# Patient Record
Sex: Female | Born: 1955 | Race: White | Hispanic: No | Marital: Married | Smoking: Never smoker
Health system: Southern US, Community
[De-identification: ages and names within clinical notes are randomized; demographics above are authoritative.]

## PROBLEM LIST (undated history)

## (undated) DIAGNOSIS — D229 Melanocytic nevi, unspecified: Secondary | ICD-10-CM

## (undated) DIAGNOSIS — M1990 Unspecified osteoarthritis, unspecified site: Secondary | ICD-10-CM

## (undated) DIAGNOSIS — T8859XA Other complications of anesthesia, initial encounter: Secondary | ICD-10-CM

## (undated) DIAGNOSIS — T4145XA Adverse effect of unspecified anesthetic, initial encounter: Secondary | ICD-10-CM

## (undated) DIAGNOSIS — K5792 Diverticulitis of intestine, part unspecified, without perforation or abscess without bleeding: Secondary | ICD-10-CM

## (undated) HISTORY — PX: BREAST EXCISIONAL BIOPSY: SUR124

## (undated) HISTORY — DX: Melanocytic nevi, unspecified: D22.9

## (undated) HISTORY — DX: Diverticulitis of intestine, part unspecified, without perforation or abscess without bleeding: K57.92

## (undated) HISTORY — PX: LIPOMA EXCISION: SHX5283

---

## 1999-07-19 HISTORY — PX: CARPAL TUNNEL RELEASE: SHX101

## 1999-08-19 ENCOUNTER — Encounter: Payer: Self-pay | Admitting: *Deleted

## 1999-08-19 ENCOUNTER — Encounter: Admission: RE | Admit: 1999-08-19 | Discharge: 1999-08-19 | Payer: Self-pay | Admitting: *Deleted

## 2001-01-22 ENCOUNTER — Other Ambulatory Visit: Admission: RE | Admit: 2001-01-22 | Discharge: 2001-01-22 | Payer: Self-pay | Admitting: Obstetrics and Gynecology

## 2001-01-30 ENCOUNTER — Encounter: Admission: RE | Admit: 2001-01-30 | Discharge: 2001-01-30 | Payer: Self-pay | Admitting: Obstetrics and Gynecology

## 2001-01-30 ENCOUNTER — Encounter: Payer: Self-pay | Admitting: Obstetrics and Gynecology

## 2002-02-12 ENCOUNTER — Encounter: Payer: Self-pay | Admitting: Obstetrics and Gynecology

## 2002-02-12 ENCOUNTER — Encounter: Admission: RE | Admit: 2002-02-12 | Discharge: 2002-02-12 | Payer: Self-pay | Admitting: Obstetrics and Gynecology

## 2002-02-18 ENCOUNTER — Ambulatory Visit (HOSPITAL_COMMUNITY): Admission: RE | Admit: 2002-02-18 | Discharge: 2002-02-18 | Payer: Self-pay | Admitting: Obstetrics and Gynecology

## 2002-02-18 ENCOUNTER — Encounter: Payer: Self-pay | Admitting: Obstetrics and Gynecology

## 2002-04-04 ENCOUNTER — Ambulatory Visit (HOSPITAL_COMMUNITY): Admission: RE | Admit: 2002-04-04 | Discharge: 2002-04-04 | Payer: Self-pay | Admitting: Obstetrics and Gynecology

## 2002-04-04 HISTORY — PX: PELVIC LAPAROSCOPY: SHX162

## 2002-11-07 DIAGNOSIS — D229 Melanocytic nevi, unspecified: Secondary | ICD-10-CM

## 2002-11-07 HISTORY — DX: Melanocytic nevi, unspecified: D22.9

## 2003-02-17 ENCOUNTER — Encounter: Admission: RE | Admit: 2003-02-17 | Discharge: 2003-02-17 | Payer: Self-pay | Admitting: Obstetrics and Gynecology

## 2003-02-17 ENCOUNTER — Encounter: Payer: Self-pay | Admitting: Obstetrics and Gynecology

## 2003-03-13 ENCOUNTER — Ambulatory Visit (HOSPITAL_COMMUNITY): Admission: RE | Admit: 2003-03-13 | Discharge: 2003-03-13 | Payer: Self-pay | Admitting: Obstetrics and Gynecology

## 2003-03-13 ENCOUNTER — Encounter: Payer: Self-pay | Admitting: Obstetrics and Gynecology

## 2003-03-18 ENCOUNTER — Encounter: Payer: Self-pay | Admitting: Obstetrics and Gynecology

## 2003-03-18 ENCOUNTER — Ambulatory Visit (HOSPITAL_COMMUNITY): Admission: RE | Admit: 2003-03-18 | Discharge: 2003-03-18 | Payer: Self-pay | Admitting: Obstetrics and Gynecology

## 2003-05-09 ENCOUNTER — Encounter: Payer: Self-pay | Admitting: Family Medicine

## 2003-05-09 ENCOUNTER — Encounter: Admission: RE | Admit: 2003-05-09 | Discharge: 2003-05-09 | Payer: Self-pay | Admitting: Family Medicine

## 2003-05-19 ENCOUNTER — Encounter: Admission: RE | Admit: 2003-05-19 | Discharge: 2003-06-11 | Payer: Self-pay | Admitting: Family Medicine

## 2003-07-19 HISTORY — PX: KNEE ARTHROSCOPY: SHX127

## 2004-04-08 ENCOUNTER — Encounter: Admission: RE | Admit: 2004-04-08 | Discharge: 2004-04-08 | Payer: Self-pay | Admitting: Obstetrics and Gynecology

## 2005-04-12 ENCOUNTER — Encounter: Admission: RE | Admit: 2005-04-12 | Discharge: 2005-04-12 | Payer: Self-pay | Admitting: Obstetrics and Gynecology

## 2005-04-20 ENCOUNTER — Encounter: Admission: RE | Admit: 2005-04-20 | Discharge: 2005-04-20 | Payer: Self-pay | Admitting: Obstetrics and Gynecology

## 2006-05-12 ENCOUNTER — Encounter: Admission: RE | Admit: 2006-05-12 | Discharge: 2006-05-12 | Payer: Self-pay | Admitting: Obstetrics and Gynecology

## 2006-05-29 ENCOUNTER — Encounter: Admission: RE | Admit: 2006-05-29 | Discharge: 2006-05-29 | Payer: Self-pay | Admitting: Obstetrics and Gynecology

## 2007-05-14 ENCOUNTER — Encounter: Admission: RE | Admit: 2007-05-14 | Discharge: 2007-05-14 | Payer: Self-pay | Admitting: Obstetrics and Gynecology

## 2008-05-14 ENCOUNTER — Encounter: Admission: RE | Admit: 2008-05-14 | Discharge: 2008-05-14 | Payer: Self-pay | Admitting: Obstetrics and Gynecology

## 2008-06-13 ENCOUNTER — Encounter: Admission: RE | Admit: 2008-06-13 | Discharge: 2008-06-13 | Payer: Self-pay | Admitting: Family Medicine

## 2009-05-28 ENCOUNTER — Encounter: Admission: RE | Admit: 2009-05-28 | Discharge: 2009-05-28 | Payer: Self-pay | Admitting: Obstetrics and Gynecology

## 2009-06-08 ENCOUNTER — Encounter: Admission: RE | Admit: 2009-06-08 | Discharge: 2009-06-08 | Payer: Self-pay | Admitting: Obstetrics and Gynecology

## 2010-06-30 ENCOUNTER — Encounter
Admission: RE | Admit: 2010-06-30 | Discharge: 2010-06-30 | Payer: Self-pay | Source: Home / Self Care | Attending: Obstetrics and Gynecology | Admitting: Obstetrics and Gynecology

## 2010-08-07 ENCOUNTER — Encounter: Payer: Self-pay | Admitting: Obstetrics and Gynecology

## 2010-08-08 ENCOUNTER — Encounter: Payer: Self-pay | Admitting: Obstetrics and Gynecology

## 2010-12-03 NOTE — Op Note (Signed)
NAMESAGAL, GAYTON                           ACCOUNT NO.:  [id number]   MEDICAL RECORD NO.:  [id number]                   PATIENT TYPE:  AMB   LOCATION:  DAY                                  FACILITY:  [facility]   PHYSICIAN:  Malachi Pro. Ambrose Mantle, M.D.              DATE OF BIRTH:  02-13-1956   DATE OF PROCEDURE:  04/04/2002  DATE OF DISCHARGE:                                 OPERATIVE REPORT   PREOPERATIVE DIAGNOSES:  1. Pelvic pain.  2. Nodularity of the left posterior cul-de-sac.   POSTOPERATIVE DIAGNOSES:  1. Pelvic pain.  2. Nodularity of the left posterior cul-de-sac.  3. Adhesions involving the left tube and ovary, the sigmoid colon, and the     left posterior cul-de-sac.   OPERATION:  Diagnostic laparoscopy.   OPERATOR:  Malachi Pro. Ambrose Mantle, M.D.   ANESTHESIA:  General.   DESCRIPTION OF PROCEDURE:  The patient was brought to the operating room and  placed in the [place] stirrups.  The abdomen, vulva, vagina, and urethra were  prepped with Betadine solution.  The bladder was emptied with a [place]  catheter.  A [place] cannula was placed into the uterus and attached to the  anterior cervical lip.  The abdomen was then draped as a sterile field.  A  small incision was made in the inferior portion of the umbilicus.  A [place]  cannula was placed into the abdominal cavity.  Aspiration revealed it was  not in a blood vessel, not in bowel or urinary contents.  Saline was  injected into the abdominal cavity, and none could be recovered.  Two liters  of CO2 were then injected into the abdominal cavity with optimal filling  pressure.  A small incision was then made suprapubically.  A smaller trocar  was inserted.  I did not actually see it enter the abdominal cavity.  The  laparoscope trocar had entered the abdominal cavity low, so that I did not  actually see the entry, although I identified it shortly after entry, and  there was no injury to any surrounding structures.  The smaller trocar  was  then used for a blunt probe, and I explored the abdominal cavity.  The liver  was smooth.  The gallbladder looked normal.  No upper abdominal  abnormalities were noted.  Exploration of the posterior cul-de-sac revealed  dense adhesions involving the left posterior cul-de-sac.  These adhesions  involved the left tube and ovary.  The left ovary was adherent to the  sigmoid colon mesentery or an appendiceal epiploica.  The left ovary and  tube were also adherent to the cul-de-sac with violin-string-like adhesions  and also denser adhesions deeper in the cul-de-sac.  I was not able to tell  exactly what caused these adhesions.  The fimbriated end of the left tube  looked normal.  There was not any visible endometriosis, although there  could have been endometriosis  deeper in the adhesions in the cul-de-sac.  The left ovary appeared normal other than the adhesions, as did the left  tube.  The uterus looked normal.  The anterior cul-de-sac looked normal.  The right tube and ovary appeared normal, and the right uterosacral ligament  area was completely normal.  I did some blunt dissection of the adhesions  but did not attempt to eliminate them because I did not know the origin,  whether they could be bowel in origin or whether they were pelvic in origin.  At any rate, there was no suggestion of malignancy.  I do think that the  patient should have a lower bowel study to make sure that she has no  evidence of diverticulosis or other disease of the bowel.  If she continues  to have the pain and the bowel study is negative, then I would suggest that  she have a hysterectomy and removal of both tubes and ovaries.  The  laparoscopic trocar was removed.  I did not see any bleeding from either  puncture site.  The lower incision was closed with Steri-Strips.  The  umbilical incision was closed with 3-0 plain catgut.  The patient seemed to  tolerate the procedure well and was returned to recovery in  satisfactory  condition.                                               Malachi Pro. Ambrose Mantle, M.D.    TFH/MEDQ  D:  04/04/2002  T:  04/04/2002  Job:  [id number]

## 2011-06-15 ENCOUNTER — Other Ambulatory Visit: Payer: Self-pay | Admitting: Obstetrics and Gynecology

## 2011-06-15 DIAGNOSIS — Z1231 Encounter for screening mammogram for malignant neoplasm of breast: Secondary | ICD-10-CM

## 2011-07-05 ENCOUNTER — Ambulatory Visit
Admission: RE | Admit: 2011-07-05 | Discharge: 2011-07-05 | Disposition: A | Discharge status: Home / Self Care | Payer: BC Managed Care – PPO | Source: Ambulatory Visit | Attending: Obstetrics and Gynecology | Admitting: Obstetrics and Gynecology

## 2011-07-05 DIAGNOSIS — Z1231 Encounter for screening mammogram for malignant neoplasm of breast: Secondary | ICD-10-CM

## 2012-05-22 ENCOUNTER — Other Ambulatory Visit: Payer: Self-pay | Admitting: Obstetrics and Gynecology

## 2012-05-22 DIAGNOSIS — Z1231 Encounter for screening mammogram for malignant neoplasm of breast: Secondary | ICD-10-CM

## 2012-07-05 ENCOUNTER — Ambulatory Visit: Payer: BC Managed Care – PPO

## 2012-08-08 ENCOUNTER — Ambulatory Visit
Admission: RE | Admit: 2012-08-08 | Discharge: 2012-08-08 | Disposition: A | Discharge status: Home / Self Care | Payer: BC Managed Care – PPO | Source: Ambulatory Visit | Attending: Obstetrics and Gynecology | Admitting: Obstetrics and Gynecology

## 2012-08-08 DIAGNOSIS — Z1231 Encounter for screening mammogram for malignant neoplasm of breast: Secondary | ICD-10-CM

## 2012-12-13 ENCOUNTER — Other Ambulatory Visit (HOSPITAL_COMMUNITY): Payer: Self-pay | Admitting: Obstetrics and Gynecology

## 2012-12-13 ENCOUNTER — Ambulatory Visit (HOSPITAL_COMMUNITY)
Admission: RE | Admit: 2012-12-13 | Discharge: 2012-12-13 | Disposition: A | Discharge status: Home / Self Care | Payer: BC Managed Care – PPO | Source: Ambulatory Visit | Attending: Obstetrics and Gynecology | Admitting: Obstetrics and Gynecology

## 2012-12-13 DIAGNOSIS — N950 Postmenopausal bleeding: Secondary | ICD-10-CM

## 2013-08-05 ENCOUNTER — Other Ambulatory Visit: Payer: Self-pay

## 2013-08-05 DIAGNOSIS — Z803 Family history of malignant neoplasm of breast: Secondary | ICD-10-CM

## 2013-08-05 DIAGNOSIS — Z1231 Encounter for screening mammogram for malignant neoplasm of breast: Secondary | ICD-10-CM

## 2013-09-02 ENCOUNTER — Ambulatory Visit
Admission: RE | Admit: 2013-09-02 | Discharge: 2013-09-02 | Disposition: A | Discharge status: Home / Self Care | Payer: BC Managed Care – PPO | Source: Ambulatory Visit

## 2013-09-02 DIAGNOSIS — Z1231 Encounter for screening mammogram for malignant neoplasm of breast: Secondary | ICD-10-CM

## 2013-09-02 DIAGNOSIS — Z803 Family history of malignant neoplasm of breast: Secondary | ICD-10-CM

## 2013-10-25 ENCOUNTER — Other Ambulatory Visit (HOSPITAL_COMMUNITY): Payer: Self-pay | Admitting: Obstetrics and Gynecology

## 2013-10-25 DIAGNOSIS — K5792 Diverticulitis of intestine, part unspecified, without perforation or abscess without bleeding: Secondary | ICD-10-CM

## 2013-10-31 ENCOUNTER — Ambulatory Visit (HOSPITAL_COMMUNITY)
Admission: RE | Admit: 2013-10-31 | Discharge: 2013-10-31 | Disposition: A | Discharge status: Home / Self Care | Payer: BC Managed Care – PPO | Source: Ambulatory Visit | Attending: Obstetrics and Gynecology | Admitting: Obstetrics and Gynecology

## 2013-10-31 DIAGNOSIS — K5792 Diverticulitis of intestine, part unspecified, without perforation or abscess without bleeding: Secondary | ICD-10-CM

## 2013-10-31 DIAGNOSIS — N949 Unspecified condition associated with female genital organs and menstrual cycle: Secondary | ICD-10-CM | POA: Insufficient documentation

## 2013-10-31 DIAGNOSIS — K5732 Diverticulitis of large intestine without perforation or abscess without bleeding: Secondary | ICD-10-CM | POA: Insufficient documentation

## 2013-10-31 DIAGNOSIS — N898 Other specified noninflammatory disorders of vagina: Secondary | ICD-10-CM | POA: Insufficient documentation

## 2013-10-31 LAB — CBC
HCT: 40.9 % (ref 36.0–46.0)
Hemoglobin: 13.7 g/dL (ref 12.0–15.0)
MCH: 29.6 pg (ref 26.0–34.0)
MCHC: 33.5 g/dL (ref 30.0–36.0)
MCV: 88.3 fL (ref 78.0–100.0)
Platelets: 306 10*3/uL (ref 150–400)
RBC: 4.63 MIL/uL (ref 3.87–5.11)
RDW: 13.5 % (ref 11.5–15.5)
WBC: 15.2 10*3/uL — ABNORMAL HIGH (ref 4.0–10.5)

## 2013-10-31 LAB — COMPREHENSIVE METABOLIC PANEL
ALT: 13 U/L (ref 0–35)
AST: 18 U/L (ref 0–37)
Albumin: 3.8 g/dL (ref 3.5–5.2)
Alkaline Phosphatase: 94 U/L (ref 39–117)
BUN: 9 mg/dL (ref 6–23)
CO2: 27 mEq/L (ref 19–32)
Calcium: 9.8 mg/dL (ref 8.4–10.5)
Chloride: 99 mEq/L (ref 96–112)
Creatinine, Ser: 0.71 mg/dL (ref 0.50–1.10)
GFR calc Af Amer: >90 mL/min (ref >90)
GFR calc non Af Amer: >90 mL/min (ref >90)
Glucose, Bld: 101 mg/dL — ABNORMAL HIGH (ref 70–99)
Potassium: 5.1 mEq/L (ref 3.7–5.3)
Sodium: 140 mEq/L (ref 137–147)
Total Bilirubin: 0.7 mg/dL (ref 0.3–1.2)
Total Protein: 7.8 g/dL (ref 6.0–8.3)

## 2013-10-31 MED ORDER — IOHEXOL 300 MG/ML  SOLN
80.0000 mL | Freq: Once | INTRAMUSCULAR | Status: AC | PRN
  Administered 2013-10-31: 80 mL via INTRAVENOUS

## 2013-11-15 ENCOUNTER — Other Ambulatory Visit: Payer: Self-pay | Admitting: Gastroenterology

## 2013-11-15 DIAGNOSIS — K5792 Diverticulitis of intestine, part unspecified, without perforation or abscess without bleeding: Secondary | ICD-10-CM

## 2013-11-25 ENCOUNTER — Ambulatory Visit
Admission: RE | Admit: 2013-11-25 | Discharge: 2013-11-25 | Disposition: A | Discharge status: Home / Self Care | Payer: BC Managed Care – PPO | Source: Ambulatory Visit | Attending: Gastroenterology | Admitting: Gastroenterology

## 2013-11-25 DIAGNOSIS — K5792 Diverticulitis of intestine, part unspecified, without perforation or abscess without bleeding: Secondary | ICD-10-CM

## 2013-11-25 MED ORDER — IOHEXOL 300 MG/ML  SOLN
100.0000 mL | Freq: Once | INTRAMUSCULAR | Status: AC | PRN
  Administered 2013-11-25: 100 mL via INTRAVENOUS

## 2013-11-29 ENCOUNTER — Other Ambulatory Visit (INDEPENDENT_AMBULATORY_CARE_PROVIDER_SITE_OTHER): Payer: Self-pay | Admitting: General Surgery

## 2013-11-29 ENCOUNTER — Encounter (INDEPENDENT_AMBULATORY_CARE_PROVIDER_SITE_OTHER): Payer: Self-pay | Admitting: General Surgery

## 2013-11-29 ENCOUNTER — Ambulatory Visit (INDEPENDENT_AMBULATORY_CARE_PROVIDER_SITE_OTHER): Payer: BC Managed Care – PPO | Admitting: General Surgery

## 2013-11-29 VITALS — BP 120/80 | HR 79 | Temp 98.8°F | Resp 14 | Ht 65.0 in | Wt 171.0 lb

## 2013-11-29 DIAGNOSIS — K5732 Diverticulitis of large intestine without perforation or abscess without bleeding: Secondary | ICD-10-CM

## 2013-11-29 DIAGNOSIS — N824 Other female intestinal-genital tract fistulae: Secondary | ICD-10-CM

## 2013-11-29 MED ORDER — CIPROFLOXACIN HCL 500 MG PO TABS: 750.0000 mg | ORAL_TABLET | Freq: Two times a day (BID) | ORAL | Status: DC

## 2013-11-29 NOTE — Patient Instructions (Signed)
Will increase the Cipro strength today. Call if here still having low-grade fevers and pain over the weekend. We'll schedule repeat CT scan in about 10 days.

## 2013-11-29 NOTE — Progress Notes (Signed)
Patient ID: Chloe Leonard, female   DOB: 05-27-1956, 58 y.o.   MRN: [id number]  Chief Complaint  Patient presents with  . Diverticulitis    HPI Chloe Leonard is a 58 y.o. female.   HPI  She is referred by Dr. 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  She has seen Dr. Ulanda Edison for this as well.  She developed some significant pelvic pain a couple of months ago and then noticed a significant volume of green foul-smelling vaginal discharge. She subsequently had a little bit of bloody discharge. She was started on doxycycline. Eventually she will underwent a CT scan. She is also noted to have a leukocytosis. 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 She was started on Cipro and Flagyl. Her symptoms improved. She subsequently underwent a CT scan for followup 4 days ago. This demonstrated persistence of inflammatory and infectious changes in the distal sigmoid colon. There is still a less than 3 cm abscess present but no air in it at this time. Recently, she has been feeling some pressure in the left lower quadrant area and some gassiness. No greenish or foul-smelling vaginal discharge. Low-grade fever of 99. She is back on Cipro and Flagyl for the past 3 days. She had a colonoscopy 5 years ago which demonstrated multiple descending and sigmoid diverticula.  Past Medical History  Diagnosis Date  . Arthritis     Past Surgical History  Procedure Laterality Date  . Breast surgery    . Carpal tunnel release  2001  . Pelvic laparoscopy  04/04/2002  . Knee arthroscopy  07/2003    Family History  Problem Relation Age of Onset  . Cancer Father     Social History History  Substance Use Topics  . Smoking status: Never Smoker   . Smokeless tobacco: Not on file  . Alcohol Use: No    Allergies  Allergen  Reactions  . Augmentin [Amoxicillin-Pot Clavulanate] Nausea And Vomiting  . Cortisone Nausea And Vomiting    Current Outpatient Prescriptions  Medication Sig Dispense Refill  . calcium-vitamin D (OSCAL WITH D) 500-200 MG-UNIT per tablet Take 2 tablets by mouth.      . cholecalciferol (VITAMIN D) 1000 UNITS tablet Take 1,000 Units by mouth daily.      . ciprofloxacin (CIPRO) 500 MG tablet Take 1.5 tablets (750 mg total) by mouth 2 (two) times daily.  50 tablet  1  . fexofenadine (ALLEGRA) 180 MG tablet Take 180 mg by mouth daily.      . fluticasone (FLONASE) 50 MCG/ACT nasal spray Place into both nostrils daily.      . metroNIDAZOLE (FLAGYL) 500 MG tablet       . Multiple Vitamin (MULTIVITAMIN) tablet Take 1 tablet by mouth daily.      Marland Kitchen omega-3 acid ethyl esters (LOVAZA) 1 G capsule Take by mouth 2 (two) times daily.       No current facility-administered medications for this visit.    Review of Systems Review of Systems  Constitutional: Negative for fever and chills.  HENT: Negative.   Respiratory: Negative.   Cardiovascular: Negative.   Gastrointestinal: Positive for nausea and abdominal pain.  Endocrine: Negative.   Genitourinary: Negative.   Musculoskeletal: Negative.   Neurological: Negative.   Hematological: Negative.     Blood pressure 120/80, pulse 79, temperature 98.8 F (37.1  C), resp. rate 14, height 5\' 5"  (1.651 m), weight 171 lb (77.565 kg).  Physical Exam Physical Exam  Constitutional: She appears well-developed and well-nourished. No distress.  HENT:  Head: Normocephalic and atraumatic.  Eyes: EOM are normal. No scleral icterus.  Neck: Neck supple.  Cardiovascular: Normal rate and regular rhythm.   Pulmonary/Chest: Effort normal and breath sounds normal.  Abdominal: Soft. She exhibits no distension and no mass. There is tenderness (Very mild in left lower quadrant). There is no guarding.  Musculoskeletal: She exhibits no edema.  Lymphadenopathy:    She  has no cervical adenopathy.  Neurological: She is alert.  Skin: Skin is warm and dry.  Psychiatric: She has a normal mood and affect. Her behavior is normal.    Data Reviewed Note from Dr. Watt Climes.  CT scans.  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 Still has signs of infection and persistence of abscess although it is less than 3 cm.     Plan    Increased Cipro to 750 mg twice a day. Continue Flagyl. Repeat CT scan in 10 days. If the situation remains unchanged, we'll see if radiology can do a percutaneous drainage of the abscess fluid. If she has worsening of her symptoms, she would need to be admitted to the hospital, started on IV antibiotics, and see if interventional radiology can do percutaneous drainage of the abscess. If this fails, she would need a partial colectomy with takedown of the fistula and a colostomy. The ultimate goal would be to have the abscess resolved, and the diverticulitis resolved, and then do colonoscopy and elective resection with primary anastomosis. Return visit with me in 2 weeks.       Rhunette Croft Briscoe Daniello 11/29/2013, 9:51 AM

## 2013-12-10 ENCOUNTER — Ambulatory Visit
Admission: RE | Admit: 2013-12-10 | Discharge: 2013-12-10 | Disposition: A | Discharge status: Home / Self Care | Payer: BC Managed Care – PPO | Source: Ambulatory Visit | Attending: General Surgery | Admitting: General Surgery

## 2013-12-10 ENCOUNTER — Telehealth (INDEPENDENT_AMBULATORY_CARE_PROVIDER_SITE_OTHER): Payer: Self-pay

## 2013-12-10 DIAGNOSIS — K5732 Diverticulitis of large intestine without perforation or abscess without bleeding: Secondary | ICD-10-CM

## 2013-12-10 MED ORDER — IOHEXOL 300 MG/ML  SOLN
100.0000 mL | Freq: Once | INTRAMUSCULAR | Status: AC | PRN
  Administered 2013-12-10: 100 mL via INTRAVENOUS

## 2013-12-10 NOTE — Telephone Encounter (Signed)
LMOV we will call pt after CT scan results available.

## 2013-12-10 NOTE — Telephone Encounter (Signed)
Will await CT findings.

## 2013-12-10 NOTE — Telephone Encounter (Signed)
Informed pt that Dr Zella Richer that we would call pt after Dr Zella Richer has seen her CT results. Pt verbalized understanding and agrees with POC

## 2013-12-10 NOTE — Telephone Encounter (Signed)
Pt being treated for sigmoid diverticulitis. Pt calling to let Dr Zella Richer know that the last several times she has had a BM she has had bright pink blood from her vagina. She is scheduled for her CT scan today. Informed pt that I would send Dr Zella Richer a message and let her know if he has any recommendations for her. Pt verbalized understanding and agrees with POC.

## 2013-12-12 ENCOUNTER — Ambulatory Visit (INDEPENDENT_AMBULATORY_CARE_PROVIDER_SITE_OTHER): Payer: BC Managed Care – PPO | Admitting: General Surgery

## 2013-12-12 ENCOUNTER — Encounter (INDEPENDENT_AMBULATORY_CARE_PROVIDER_SITE_OTHER): Payer: BC Managed Care – PPO | Admitting: General Surgery

## 2013-12-12 DIAGNOSIS — K5732 Diverticulitis of large intestine without perforation or abscess without bleeding: Secondary | ICD-10-CM

## 2013-12-12 NOTE — Patient Instructions (Signed)
We will plan on admitting you Monday, June 1.  Continue your current antibiotics for now.

## 2013-12-12 NOTE — Progress Notes (Signed)
Patient ID: Chloe Leonard, female   DOB: 1956-02-04, 58 y.o.   MRN: [id number]  Chief Complaint  Patient presents with  . Diverticulitis    HPI Chloe Leonard is a 58 y.o. female.   HPI  She is here for followup of her sigmoid diverticulitis with colovaginal fistula. She had one episode where she had discharge of white cloudy fluid from the vagina simultaneous with her bowel movements. She is working. She denies fever or chills. She continues on her antibiotics. CT scan done 2 days ago demonstrates continuation of the inflammatory changes of the sigmoid colon. The fluid collections are persistent and possibly slightly larger. There appears to be some hyperdense material in the vagina.  Past Medical History  Diagnosis Date  . Arthritis     Past Surgical History  Procedure Laterality Date  . Breast surgery    . Carpal tunnel release  2001  . Pelvic laparoscopy  04/04/2002  . Knee arthroscopy  07/2003    Family History  Problem Relation Age of Onset  . Cancer Father     Social History History  Substance Use Topics  . Smoking status: Never Smoker   . Smokeless tobacco: Not on file  . Alcohol Use: No    Allergies  Allergen Reactions  . Augmentin [Amoxicillin-Pot Clavulanate] Nausea And Vomiting  . Cortisone Nausea And Vomiting    Current Outpatient Prescriptions  Medication Sig Dispense Refill  . calcium-vitamin D (OSCAL WITH D) 500-200 MG-UNIT per tablet Take 2 tablets by mouth.      . cholecalciferol (VITAMIN D) 1000 UNITS tablet Take 1,000 Units by mouth daily.      . ciprofloxacin (CIPRO) 500 MG tablet Take 1.5 tablets (750 mg total) by mouth 2 (two) times daily.  50 tablet  1  . fexofenadine (ALLEGRA) 180 MG tablet Take 180 mg by mouth daily.      . fluticasone (FLONASE) 50 MCG/ACT nasal spray Place into both nostrils daily.      . metroNIDAZOLE (FLAGYL) 500 MG tablet       . Multiple Vitamin (MULTIVITAMIN) tablet Take 1 tablet by mouth daily.      Marland Kitchen omega-3 acid ethyl  esters (LOVAZA) 1 G capsule Take by mouth 2 (two) times daily.       No current facility-administered medications for this visit.    Review of Systems Review of Systems  Constitutional: Negative for fever and chills.  Gastrointestinal:       Some fecal urgency at times  Genitourinary: Positive for vaginal bleeding and vaginal discharge.    There were no vitals taken for this visit.  Physical Exam Physical Exam  Constitutional: She appears well-developed and well-nourished. No distress.  HENT:  Head: Normocephalic and atraumatic.  Cardiovascular: Normal rate and regular rhythm.   Pulmonary/Chest: Effort normal and breath sounds normal.  Abdominal: Soft. She exhibits no distension and no mass. There is no tenderness.    Data Reviewed CT scan report.  Assessment    Persistent sigmoid diverticulitis with small abscesses and evidence of colovaginal fistula. Clinically, she looks much better than the CT scan does. However, oral antibiotics do not seem to be helping her at this time with respect to resolving the process. I spoke with one of the interventional radiologist regarding this situation. They felt they could aspirate the perirectal abscess but were not sure that a drain could be placed.    Plan    I spoke with her about being admitted to the hospital, being  started on IV antibiotics, and having percutaneous aspiration or drainage of the abscess. The other option would be to proceed with sigmoid colectomy and colostomy. She would like to try the antibiotics and percutaneous drainage at this time. However, her mother is going to have major surgery tomorrow. Given that she is clinically stable, we have agreed on admitting her on Monday for IV antibiotics and percutaneous drainage. She will stay in the hospital and we will repeat a CT with her on antibiotics in about 4-5 days. If she fails to respond to this treatment, she realizes she would need the operation as we have discussed.        Rhunette Croft Chloe Leonard 12/12/2013, 5:24 PM

## 2013-12-13 ENCOUNTER — Other Ambulatory Visit (INDEPENDENT_AMBULATORY_CARE_PROVIDER_SITE_OTHER): Payer: Self-pay | Admitting: General Surgery

## 2013-12-13 ENCOUNTER — Encounter (INDEPENDENT_AMBULATORY_CARE_PROVIDER_SITE_OTHER): Payer: Self-pay | Admitting: General Surgery

## 2013-12-13 DIAGNOSIS — N739 Female pelvic inflammatory disease, unspecified: Secondary | ICD-10-CM

## 2013-12-13 NOTE — Progress Notes (Signed)
Patient ID: Chloe Leonard, female   DOB: June 11, 1956, 58 y.o.   MRN: [id number] I have arranged for her to have interventional radiology aspirate or put a drain in the pelvic abscess on June 1. Plan will be to admit her on June 1 and place her on IV antibiotics. I have informed her of this. I have spoken with the interventional radiologist. He asked me to make sure she was n.p.o. after midnight for the procedure and I discussed this with her.

## 2013-12-16 ENCOUNTER — Inpatient Hospital Stay (HOSPITAL_COMMUNITY)
Admission: AD | Admit: 2013-12-16 | Discharge: 2013-12-19 | DRG: 394 | Disposition: A | Discharge status: Home / Self Care | Payer: BC Managed Care – PPO | Source: Ambulatory Visit | Attending: General Surgery | Admitting: General Surgery

## 2013-12-16 ENCOUNTER — Encounter (HOSPITAL_COMMUNITY): Payer: Self-pay

## 2013-12-16 ENCOUNTER — Inpatient Hospital Stay (HOSPITAL_COMMUNITY): Payer: BC Managed Care – PPO

## 2013-12-16 DIAGNOSIS — N321 Vesicointestinal fistula: Secondary | ICD-10-CM

## 2013-12-16 DIAGNOSIS — N898 Other specified noninflammatory disorders of vagina: Secondary | ICD-10-CM | POA: Diagnosis present

## 2013-12-16 DIAGNOSIS — B958 Unspecified staphylococcus as the cause of diseases classified elsewhere: Secondary | ICD-10-CM | POA: Diagnosis present

## 2013-12-16 DIAGNOSIS — M129 Arthropathy, unspecified: Secondary | ICD-10-CM | POA: Diagnosis present

## 2013-12-16 DIAGNOSIS — Z79899 Other long term (current) drug therapy: Secondary | ICD-10-CM

## 2013-12-16 DIAGNOSIS — N824 Other female intestinal-genital tract fistulae: Principal | ICD-10-CM | POA: Diagnosis present

## 2013-12-16 DIAGNOSIS — K630 Abscess of intestine: Secondary | ICD-10-CM | POA: Diagnosis present

## 2013-12-16 DIAGNOSIS — Z880 Allergy status to penicillin: Secondary | ICD-10-CM

## 2013-12-16 DIAGNOSIS — K5732 Diverticulitis of large intestine without perforation or abscess without bleeding: Secondary | ICD-10-CM

## 2013-12-16 DIAGNOSIS — N739 Female pelvic inflammatory disease, unspecified: Secondary | ICD-10-CM

## 2013-12-16 DIAGNOSIS — Z888 Allergy status to other drugs, medicaments and biological substances status: Secondary | ICD-10-CM

## 2013-12-16 LAB — CBC
HCT: 40.6 % (ref 36.0–46.0)
Hemoglobin: 13.5 g/dL (ref 12.0–15.0)
MCH: 29.3 pg (ref 26.0–34.0)
MCHC: 33.3 g/dL (ref 30.0–36.0)
MCV: 88.3 fL (ref 78.0–100.0)
Platelets: 332 10*3/uL (ref 150–400)
RBC: 4.6 MIL/uL (ref 3.87–5.11)
RDW: 14.3 % (ref 11.5–15.5)
WBC: 7.4 10*3/uL (ref 4.0–10.5)

## 2013-12-16 LAB — HEPATIC FUNCTION PANEL
ALT: 17 U/L (ref 0–35)
AST: 27 U/L (ref 0–37)
Albumin: 4 g/dL (ref 3.5–5.2)
Alkaline Phosphatase: 81 U/L (ref 39–117)
Bilirubin, Direct: <0.2 mg/dL (ref 0.0–0.3)
Total Bilirubin: 0.4 mg/dL (ref 0.3–1.2)
Total Protein: 8 g/dL (ref 6.0–8.3)

## 2013-12-16 LAB — BASIC METABOLIC PANEL
BUN: 11 mg/dL (ref 6–23)
CO2: 28 mEq/L (ref 19–32)
Calcium: 9.9 mg/dL (ref 8.4–10.5)
Chloride: 102 mEq/L (ref 96–112)
Creatinine, Ser: 0.72 mg/dL (ref 0.50–1.10)
GFR calc Af Amer: >90 mL/min (ref >90)
GFR calc non Af Amer: >90 mL/min (ref >90)
Glucose, Bld: 93 mg/dL (ref 70–99)
Potassium: 4.5 mEq/L (ref 3.7–5.3)
Sodium: 142 mEq/L (ref 137–147)

## 2013-12-16 LAB — PROTIME-INR
INR: 0.96 (ref 0.00–1.49)
Prothrombin Time: 12.6 seconds (ref 11.6–15.2)

## 2013-12-16 MED ORDER — FENTANYL CITRATE 0.05 MG/ML IJ SOLN
INTRAMUSCULAR | Status: AC | PRN
  Administered 2013-12-16 (×3): 25 ug via INTRAVENOUS

## 2013-12-16 MED ORDER — ONDANSETRON HCL 4 MG/2ML IJ SOLN: 4.0000 mg | Freq: Four times a day (QID) | INTRAMUSCULAR | Status: DC | PRN

## 2013-12-16 MED ORDER — MIDAZOLAM HCL 2 MG/2ML IJ SOLN
INTRAMUSCULAR | Status: AC | PRN
  Administered 2013-12-16: 1 mg via INTRAVENOUS
  Administered 2013-12-16 (×2): 0.5 mg via INTRAVENOUS
  Administered 2013-12-16: 1 mg via INTRAVENOUS

## 2013-12-16 MED ORDER — FENTANYL CITRATE 0.05 MG/ML IJ SOLN
INTRAMUSCULAR | Status: AC
  Filled 2013-12-16: qty 6

## 2013-12-16 MED ORDER — PIPERACILLIN-TAZOBACTAM 3.375 G IVPB
3.3750 g | Freq: Three times a day (TID) | INTRAVENOUS | Status: DC
  Administered 2013-12-16 – 2013-12-19 (×9): 3.375 g via INTRAVENOUS
  Filled 2013-12-16 (×12): qty 50

## 2013-12-16 MED ORDER — HYDROCODONE-ACETAMINOPHEN 5-325 MG PO TABS
1.0000 | ORAL_TABLET | ORAL | Status: DC | PRN
  Administered 2013-12-17 (×2): 1 via ORAL
  Filled 2013-12-16 (×2): qty 1

## 2013-12-16 MED ORDER — MIDAZOLAM HCL 2 MG/2ML IJ SOLN
INTRAMUSCULAR | Status: AC
  Filled 2013-12-16: qty 6

## 2013-12-16 NOTE — Procedures (Signed)
Procedure:  CT guided needle drainage Findings:  Prerectal fluid less prominent.  18 G needle advanced into space between rectum and vagina, yielding 4 mL of purulent appearing fluid.  Sample sent for culture analysis. No immediate complications.

## 2013-12-16 NOTE — H&P (Signed)
Chloe Leonard is an 58 y.o. female.   Chief Complaint: Persistent sigmoid diverticulitis with colovaginal fistula HPI: She was diagnosed with sigmoid diverticulitis with less than 3 cm pelvic abscess and colovaginal fistula. This was in early March. She has been on oral antibiotics without resolution of this problem. She is relatively asymptomatic except for a intermittent pink vaginal discharge. With her current condition, she would need a Hartmann procedure and repair of the fistula. She is admitted today for percutaneous aspiration of the persistent pelvic abscess and treatment with broad-spectrum intravenous antibiotics. Currently she denies fever or chills and is in no pain. She has been working throughout all of this.  Past Medical History  Diagnosis Date  . Arthritis     Past Surgical History  Procedure Laterality Date  . Breast surgery    . Carpal tunnel release  2001  . Pelvic laparoscopy  04/04/2002  . Knee arthroscopy  07/2003    Family History  Problem Relation Age of Onset  . Cancer Father    Social History:  reports that she has never smoked. She has never used smokeless tobacco. She reports that she does not drink alcohol or use illicit drugs.  Allergies:  Allergies  Allergen Reactions  . Augmentin [Amoxicillin-Pot Clavulanate] Nausea And Vomiting  . Cortisone Other (See Comments)    Lips swell and face flushes    Prior to Admission medications   Medication Sig Start Date End Date Taking? Authorizing Provider  calcium gluconate 500 MG tablet Take 1 tablet by mouth 2 (two) times daily.   Yes Historical Provider, MD  cholecalciferol (VITAMIN D) 1000 UNITS tablet Take 1,000 Units by mouth daily.   Yes Historical Provider, MD  ciprofloxacin (CIPRO) 750 MG tablet Take 750 mg by mouth 2 (two) times daily. Been taking for 3 weeks 11/26/13  Yes Historical Provider, MD  fexofenadine (ALLEGRA) 180 MG tablet Take 180 mg by mouth at bedtime.    Yes Historical Provider, MD   fluticasone (FLONASE) 50 MCG/ACT nasal spray Place into both nostrils daily.   Yes Historical Provider, MD  metroNIDAZOLE (FLAGYL) 500 MG tablet Take 500 mg by mouth 2 (two) times daily. Being taking for 3 weeks 11/26/13  Yes Historical Provider, MD  Multiple Vitamin (MULTIVITAMIN) tablet Take 1 tablet by mouth daily with breakfast.    Yes Historical Provider, MD  Omega-3 Fatty Acids (FISH OIL PO) Take 1 capsule by mouth 2 (two) times daily.   Yes Historical Provider, MD     Medications Prior to Admission  Medication Sig Dispense Refill  . calcium gluconate 500 MG tablet Take 1 tablet by mouth 2 (two) times daily.      . cholecalciferol (VITAMIN D) 1000 UNITS tablet Take 1,000 Units by mouth daily.      . ciprofloxacin (CIPRO) 750 MG tablet Take 750 mg by mouth 2 (two) times daily. Been taking for 3 weeks      . fexofenadine (ALLEGRA) 180 MG tablet Take 180 mg by mouth at bedtime.       . fluticasone (FLONASE) 50 MCG/ACT nasal spray Place into both nostrils daily.      . metroNIDAZOLE (FLAGYL) 500 MG tablet Take 500 mg by mouth 2 (two) times daily. Being taking for 3 weeks      . Multiple Vitamin (MULTIVITAMIN) tablet Take 1 tablet by mouth daily with breakfast.       . Omega-3 Fatty Acids (FISH OIL PO) Take 1 capsule by mouth 2 (two) times daily.  Results for orders placed during the hospital encounter of 12/16/13 (from the past 48 hour(s))  CBC     Status: None   Collection Time    12/16/13  9:41 AM      Result Value Ref Range   WBC 7.4  4.0 - 10.5 K/uL   RBC 4.60  3.87 - 5.11 MIL/uL   Hemoglobin 13.5  12.0 - 15.0 g/dL   HCT 16.1  09.6 - 04.5 %   MCV 88.3  78.0 - 100.0 fL   MCH 29.3  26.0 - 34.0 pg   MCHC 33.3  30.0 - 36.0 g/dL   RDW 40.9  81.1 - 91.4 %   Platelets 332  150 - 400 K/uL  PROTIME-INR     Status: None   Collection Time    12/16/13  9:41 AM      Result Value Ref Range   Prothrombin Time 12.6  11.6 - 15.2 seconds   INR 0.96  0.00 - 1.49  BASIC METABOLIC  PANEL     Status: None   Collection Time    12/16/13  9:41 AM      Result Value Ref Range   Sodium 142  137 - 147 mEq/L   Potassium 4.5  3.7 - 5.3 mEq/L   Chloride 102  96 - 112 mEq/L   CO2 28  19 - 32 mEq/L   Glucose, Bld 93  70 - 99 mg/dL   BUN 11  6 - 23 mg/dL   Creatinine, Ser 7.82  0.50 - 1.10 mg/dL   Calcium 9.9  8.4 - 95.6 mg/dL   GFR calc non Af Amer >90  >90 mL/min   GFR calc Af Amer >90  >90 mL/min   Comment: (NOTE)     The eGFR has been calculated using the CKD EPI equation.     This calculation has not been validated in all clinical situations.     eGFR's persistently <90 mL/min signify possible Chronic Kidney     Disease.  HEPATIC FUNCTION PANEL     Status: None   Collection Time    12/16/13 12:30 PM      Result Value Ref Range   Total Protein 8.0  6.0 - 8.3 g/dL   Albumin 4.0  3.5 - 5.2 g/dL   AST 27  0 - 37 U/L   ALT 17  0 - 35 U/L   Alkaline Phosphatase 81  39 - 117 U/L   Total Bilirubin 0.4  0.3 - 1.2 mg/dL   Bilirubin, Direct <2.1  0.0 - 0.3 mg/dL   Indirect Bilirubin NOT CALCULATED  0.3 - 0.9 mg/dL   Ct Aspiration  3/0/8657   CLINICAL DATA:  History of diverticulitis with development of rectovaginal fistula. Persistent small amount of fluid by CT between the rectum and vagina is suspicious for residual abscess. Request has been made to aspirate the collection.  EXAM: CT GUIDED NEEDLE ASPIRATION OF PELVIC ABSCESS  ANESTHESIA/SEDATION: 3.0  Mg IV Versed; 75 mcg IV Fentanyl  Total Moderate Sedation Time: 25 minutes.  COMPARISON:  CT of the abdomen and pelvis on 12/10/2013.  PROCEDURE: The procedure risks, benefits, and alternatives were explained to the patient. Questions regarding the procedure were encouraged and answered. The patient understands and consents to the procedure.  The right transgluteal region was prepped with Betadine in a sterile fashion, and a sterile drape was applied covering the operative field. A sterile gown and sterile gloves were used for  the procedure. Local anesthesia  was provided with 1% Lidocaine.  CT was performed in a prone position through the pelvis. After localizing a pelvic abscess, an 18 gauge trocar needle was advanced under CT guidance to the level of the collection. Aspiration was performed. The collection was sent for culture analysis. The needle was removed and additional CT performed.  COMPLICATIONS: None  FINDINGS: By unenhanced CT, the small amount of fluid positioned between the right anterior rectum and vagina appeared potentially smaller. However, there was a window available to aspirate this region under CT guidance. Aspiration yielded 4 mL of purulent appearing fluid. CT shows no significant fluid remaining and no evidence of immediate complication.  IMPRESSION: CT-guided aspiration performed of the pelvic abscess situated between the rectum and vagina. 4 mL of purulent appearing fluid was aspirated and sent for culture analysis.   Electronically Signed   By: Aletta Edouard M.D.   On: 12/16/2013 13:53    Review of Systems  Constitutional: Negative for fever, chills, weight loss and malaise/fatigue.  Respiratory: Negative.   Cardiovascular: Negative.   Gastrointestinal: Negative.   Genitourinary:       Pink vaginal discharge  Neurological: Negative for weakness.    Blood pressure 106/68, pulse 71, temperature 97.9 F (36.6 C), temperature source Oral, resp. rate 16, height $RemoveBe'5\' 5"'AzrEazTEO$  (1.651 m), weight 171 lb (77.565 kg), SpO2 97.00%. Physical Exam  Constitutional: She appears well-developed and well-nourished. No distress.  HENT:  Head: Normocephalic and atraumatic.  Eyes: No scleral icterus.  Cardiovascular: Normal rate and regular rhythm.   Respiratory: Effort normal and breath sounds normal.  GI: Soft. She exhibits no distension and no mass. There is no tenderness.  Musculoskeletal: She exhibits no edema.  Neurological: She is alert.  Skin: Skin is warm and dry.  Psychiatric: She has a normal mood and  affect. Her behavior is normal.     Assessment/Plan Sigmoid diverticulitis with persistent less than 3 cm pelvic abscess and colovaginal fistula. Mostly has pink discharge from the vagina and no feculent discharge.  CT has showed contrast in the vagina in the past. She is relatively asymptomatic except for the vaginal discharge.  Plan: Admission to the hospital with broad-spectrum IV antibiotics (Zosyn). Percutaneous aspiration/drainage of a pelvic abscess. Repeat CT scan on Thursday area if the situation improves, will send her home on high-dose oral Augmentin and Flagyl. Optimally she would have a colonoscopy and then a 1 stage procedure. If the situation does not improve, we'll discuss and schedule partial colectomy with repair of fistula and colostomy.  Rhunette Croft Loveta Dellis 12/16/2013, 3:12 PM

## 2013-12-16 NOTE — H&P (Signed)
Agree.  For CT guided transgluteal aspiration of pelvic fluid today.

## 2013-12-16 NOTE — H&P (Signed)
Chief Complaint: "Abdominal fluid collection." Referring Physician: Dr. Zella Richer HPI: Chloe Leonard is an 58 y.o. female who had a follow-up visit with Dr. Zella Richer on 5/28 with episodes of vaginal discharge and bleeding and known sigmoid diverticulitis with colovaginal fistula. She had a CT which revealed a persistent perirectal fluid collection with some expansion along  the right side of the rectum concerning for abscess. IR received request for image guided aspiration with possible drain placement. The patient is currently on antibiotics and denies any abdominal/pelvic pain. She denies any fever or chills. She denies any chest pain, shortness of breath or palpitations. She admits to vaginal bleeding. The patient denies any history of sleep apnea or chronic oxygen use. She has previously tolerated sedation without complications.   Past Medical History:  Past Medical History  Diagnosis Date  . Arthritis     Past Surgical History:  Past Surgical History  Procedure Laterality Date  . Breast surgery    . Carpal tunnel release  2001  . Pelvic laparoscopy  04/04/2002  . Knee arthroscopy  07/2003    Family History:  Family History  Problem Relation Age of Onset  . Cancer Father     Social History:  reports that she has never smoked. She has never used smokeless tobacco. She reports that she does not drink alcohol or use illicit drugs.  Allergies:  Allergies  Allergen Reactions  . Augmentin [Amoxicillin-Pot Clavulanate] Nausea And Vomiting  . Cortisone Nausea And Vomiting    Medications:   Medication List    ASK your doctor about these medications       calcium-vitamin D 500-200 MG-UNIT per tablet  Commonly known as:  OSCAL WITH D  Take 2 tablets by mouth.     cholecalciferol 1000 UNITS tablet  Commonly known as:  VITAMIN D  Take 1,000 Units by mouth daily.     ciprofloxacin 500 MG tablet  Commonly known as:  CIPRO  Take 1.5 tablets (750 mg total) by mouth 2 (two)  times daily.     fexofenadine 180 MG tablet  Commonly known as:  ALLEGRA  Take 180 mg by mouth daily.     fluticasone 50 MCG/ACT nasal spray  Commonly known as:  FLONASE  Place into both nostrils daily.     metroNIDAZOLE 500 MG tablet  Commonly known as:  FLAGYL     multivitamin tablet  Take 1 tablet by mouth daily.     omega-3 acid ethyl esters 1 G capsule  Commonly known as:  LOVAZA  Take by mouth 2 (two) times daily.       Please HPI for pertinent positives, otherwise complete 10 system ROS negative.  Physical Exam: T: 98.8 F, BP: 120/80 mmHg, HR: 79 bpm, resp: 14 rpm  General Appearance:  Alert, cooperative, no distress  Head:  Normocephalic, without obvious abnormality, atraumatic  Neck: Supple, symmetrical, trachea midline  Lungs:   Clear to auscultation bilaterally, no w/r/r, respirations unlabored without use of accessory muscles.  Chest Wall:  No tenderness or deformity  Heart:  Regular rate and rhythm, S1, S2 normal, no murmur, rub or gallop.  Abdomen:   Soft, non-tender, non distended, (+) BS  Extremities: Extremities normal, atraumatic, no cyanosis or edema  Neurologic: Normal affect, no gross deficits.   Results for orders placed during the hospital encounter of 12/16/13 (from the past 48 hour(s))  CBC     Status: None   Collection Time    12/16/13  9:41 AM  Result Value Ref Range   WBC 7.4  4.0 - 10.5 K/uL   RBC 4.60  3.87 - 5.11 MIL/uL   Hemoglobin 13.5  12.0 - 15.0 g/dL   HCT 40.6  36.0 - 46.0 %   MCV 88.3  78.0 - 100.0 fL   MCH 29.3  26.0 - 34.0 pg   MCHC 33.3  30.0 - 36.0 g/dL   RDW 14.3  11.5 - 15.5 %   Platelets 332  150 - 400 K/uL  PROTIME-INR     Status: None   Collection Time    12/16/13  9:41 AM      Result Value Ref Range   Prothrombin Time 12.6  11.6 - 15.2 seconds   INR 0.96  0.00 - 1.49   No results found.  Assessment/Plan Sigmoid diverticulitis with pelvic abscess Request for image guided pelvic fluid collection aspiration  with possible drain placement with moderate sedation. Patient has been NPO, no blood thinners taken, on antibiotics, labs and images reviewed Risks and Benefits discussed with the patient. All of the patient's questions were answered, patient is agreeable to proceed. Consent signed and in chart.   [name] PA-C 12/16/2013, 10:12 AM

## 2013-12-17 MED ORDER — ENOXAPARIN SODIUM 40 MG/0.4ML ~~LOC~~ SOLN
40.0000 mg | SUBCUTANEOUS | Status: DC
  Administered 2013-12-17 – 2013-12-19 (×3): 40 mg via SUBCUTANEOUS
  Filled 2013-12-17 (×3): qty 0.4

## 2013-12-17 NOTE — Care Management Note (Signed)
    Page 1 of 1   12/17/2013     1:25:36 PM CARE MANAGEMENT NOTE 12/17/2013  Patient:  Chloe Leonard, Chloe Leonard   Account Number:  [id number]  Date Initiated:  12/17/2013  Documentation initiated by:  Sunday Spillers  Subjective/Objective Assessment:   58 yo female admitted with diverticulitis, colo-vaginal fistula with abscess. PTA lived at home with spouse.     Action/Plan:   Home when stable   Anticipated DC Date:  12/20/2013   Anticipated DC Plan:  [place]  CM consult      Choice offered to / List presented to:             Status of service:  Completed, signed off Medicare Important Message given?  NA - LOS <3 / Initial given by admissions (If response is "NO", the following Medicare IM given date fields will be blank) Date Medicare IM given:   Date Additional Medicare IM given:    Discharge Disposition:  HOME/SELF CARE  Per UR Regulation:  Reviewed for med. necessity/level of care/duration of stay  If discussed at [place] of Stay Meetings, dates discussed:    Comments:

## 2013-12-17 NOTE — Progress Notes (Signed)
Subjective: No abdominal pain.  Bowels moving.  Stools are small in caliber.  Objective: Vital signs in last 24 hours: Temp:  [97.5 F (36.4 C)-98.7 F (37.1 C)] 97.9 F (36.6 C) (06/02 0539) Pulse Rate:  [59-80] 62 (06/02 0539) Resp:  [10-44] 18 (06/02 0539) BP: (93-118)/(57-79) 93/58 mmHg (06/02 0539) SpO2:  [15 %-100 %] 96 % (06/02 0539) Weight:  [171 lb (77.565 kg)] 171 lb (77.565 kg) (06/01 1017) Last BM Date: 12/16/13  Intake/Output from previous day: 06/01 0701 - 06/02 0700 In: 630 [P.O.:480; IV Piggyback:150] Out: 1825 [Urine:1825] Intake/Output this shift:    PE: General- In NAD Abdomen-soft, nontender  Lab Results:   Recent Labs  12/16/13 0941  WBC 7.4  HGB 13.5  HCT 40.6  PLT 332   BMET  Recent Labs  12/16/13 0941  NA 142  K 4.5  CL 102  CO2 28  GLUCOSE 93  BUN 11  CREATININE 0.72  CALCIUM 9.9   PT/INR  Recent Labs  12/16/13 0941  LABPROT 12.6  INR 0.96   Comprehensive Metabolic Panel:    Component Value Date/Time   NA 142 12/16/2013 0941   NA 140 10/31/2013 0904   K 4.5 12/16/2013 0941   K 5.1 10/31/2013 0904   CL 102 12/16/2013 0941   CL 99 10/31/2013 0904   CO2 28 12/16/2013 0941   CO2 27 10/31/2013 0904   BUN 11 12/16/2013 0941   BUN 9 10/31/2013 0904   CREATININE 0.72 12/16/2013 0941   CREATININE 0.71 10/31/2013 0904   GLUCOSE 93 12/16/2013 0941   GLUCOSE 101* 10/31/2013 0904   CALCIUM 9.9 12/16/2013 0941   CALCIUM 9.8 10/31/2013 0904   AST 27 12/16/2013 1230   AST 18 10/31/2013 0904   ALT 17 12/16/2013 1230   ALT 13 10/31/2013 0904   ALKPHOS 81 12/16/2013 1230   ALKPHOS 94 10/31/2013 0904   BILITOT 0.4 12/16/2013 1230   BILITOT 0.7 10/31/2013 0904   PROT 8.0 12/16/2013 1230   PROT 7.8 10/31/2013 0904   ALBUMIN 4.0 12/16/2013 1230   ALBUMIN 3.8 10/31/2013 0904   Abscess culture pending  Studies/Results: Ct Aspiration  12/16/2013   CLINICAL DATA:  History of diverticulitis with development of rectovaginal fistula. Persistent small amount of fluid  by CT between the rectum and vagina is suspicious for residual abscess. Request has been made to aspirate the collection.  EXAM: CT GUIDED NEEDLE ASPIRATION OF PELVIC ABSCESS  ANESTHESIA/SEDATION: 3.0  Mg IV Versed; 75 mcg IV Fentanyl  Total Moderate Sedation Time: 25 minutes.  COMPARISON:  CT of the abdomen and pelvis on 12/10/2013.  PROCEDURE: The procedure risks, benefits, and alternatives were explained to the patient. Questions regarding the procedure were encouraged and answered. The patient understands and consents to the procedure.  The right transgluteal region was prepped with Betadine in a sterile fashion, and a sterile drape was applied covering the operative field. A sterile gown and sterile gloves were used for the procedure. Local anesthesia was provided with 1% Lidocaine.  CT was performed in a prone position through the pelvis. After localizing a pelvic abscess, an 18 gauge trocar needle was advanced under CT guidance to the level of the collection. Aspiration was performed. The collection was sent for culture analysis. The needle was removed and additional CT performed.  COMPLICATIONS: None  FINDINGS: By unenhanced CT, the small amount of fluid positioned between the right anterior rectum and vagina appeared potentially smaller. However, there was a window available to aspirate this  region under CT guidance. Aspiration yielded 4 mL of purulent appearing fluid. CT shows no significant fluid remaining and no evidence of immediate complication.  IMPRESSION: CT-guided aspiration performed of the pelvic abscess situated between the rectum and vagina. 4 mL of purulent appearing fluid was aspirated and sent for culture analysis.   Electronically Signed   By: Aletta Edouard M.D.   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  Repeat CT Thursday.   Rhunette Croft Alford Gamero 12/17/2013

## 2013-12-18 ENCOUNTER — Inpatient Hospital Stay (HOSPITAL_COMMUNITY): Payer: BC Managed Care – PPO

## 2013-12-18 NOTE — Progress Notes (Signed)
Subjective: Had a little bloody vaginal discharge.  Occasional crampy abdominal pain.  Objective: Vital signs in last 24 hours: Temp:  [97.4 F (36.3 C)-98.3 F (36.8 C)] 98.3 F (36.8 C) (06/03 0608) Pulse Rate:  [68-78] 68 (06/03 0608) Resp:  [16-18] 16 (06/03 0608) BP: (107-115)/(65-78) 107/65 mmHg (06/03 0608) SpO2:  [97 %-100 %] 100 % (06/03 0608) Last BM Date: 12/17/13  Intake/Output from previous day: 06/02 0701 - 06/03 0700 In: -  Out: 3550 [Urine:3550] Intake/Output this shift:    PE: General- In NAD Abdomen-soft, nontender  Lab Results:   Recent Labs  12/16/13 0941  WBC 7.4  HGB 13.5  HCT 40.6  PLT 332   BMET  Recent Labs  12/16/13 0941  NA 142  K 4.5  CL 102  CO2 28  GLUCOSE 93  BUN 11  CREATININE 0.72  CALCIUM 9.9   PT/INR  Recent Labs  12/16/13 0941  LABPROT 12.6  INR 0.96   Comprehensive Metabolic Panel:    Component Value Date/Time   NA 142 12/16/2013 0941   NA 140 10/31/2013 0904   K 4.5 12/16/2013 0941   K 5.1 10/31/2013 0904   CL 102 12/16/2013 0941   CL 99 10/31/2013 0904   CO2 28 12/16/2013 0941   CO2 27 10/31/2013 0904   BUN 11 12/16/2013 0941   BUN 9 10/31/2013 0904   CREATININE 0.72 12/16/2013 0941   CREATININE 0.71 10/31/2013 0904   GLUCOSE 93 12/16/2013 0941   GLUCOSE 101* 10/31/2013 0904   CALCIUM 9.9 12/16/2013 0941   CALCIUM 9.8 10/31/2013 0904   AST 27 12/16/2013 1230   AST 18 10/31/2013 0904   ALT 17 12/16/2013 1230   ALT 13 10/31/2013 0904   ALKPHOS 81 12/16/2013 1230   ALKPHOS 94 10/31/2013 0904   BILITOT 0.4 12/16/2013 1230   BILITOT 0.7 10/31/2013 0904   PROT 8.0 12/16/2013 1230   PROT 7.8 10/31/2013 0904   ALBUMIN 4.0 12/16/2013 1230   ALBUMIN 3.8 10/31/2013 0904   Abscess culture pending  Studies/Results: Ct Aspiration  12/16/2013   CLINICAL DATA:  History of diverticulitis with development of rectovaginal fistula. Persistent small amount of fluid by CT between the rectum and vagina is suspicious for residual abscess. Request  has been made to aspirate the collection.  EXAM: CT GUIDED NEEDLE ASPIRATION OF PELVIC ABSCESS  ANESTHESIA/SEDATION: 3.0  Mg IV Versed; 75 mcg IV Fentanyl  Total Moderate Sedation Time: 25 minutes.  COMPARISON:  CT of the abdomen and pelvis on 12/10/2013.  PROCEDURE: The procedure risks, benefits, and alternatives were explained to the patient. Questions regarding the procedure were encouraged and answered. The patient understands and consents to the procedure.  The right transgluteal region was prepped with Betadine in a sterile fashion, and a sterile drape was applied covering the operative field. A sterile gown and sterile gloves were used for the procedure. Local anesthesia was provided with 1% Lidocaine.  CT was performed in a prone position through the pelvis. After localizing a pelvic abscess, an 18 gauge trocar needle was advanced under CT guidance to the level of the collection. Aspiration was performed. The collection was sent for culture analysis. The needle was removed and additional CT performed.  COMPLICATIONS: None  FINDINGS: By unenhanced CT, the small amount of fluid positioned between the right anterior rectum and vagina appeared potentially smaller. However, there was a window available to aspirate this region under CT guidance. Aspiration yielded 4 mL of purulent appearing fluid. CT shows no  significant fluid remaining and no evidence of immediate complication.  IMPRESSION: CT-guided aspiration performed of the pelvic abscess situated between the rectum and vagina. 4 mL of purulent appearing fluid was aspirated and sent for culture analysis.   Electronically Signed   By: Aletta Edouard M.D.   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  Repeat CT tomorrow.   Rhunette Croft Prudencio Velazco 12/18/2013

## 2013-12-19 ENCOUNTER — Encounter (HOSPITAL_COMMUNITY): Payer: Self-pay | Admitting: Radiology

## 2013-12-19 ENCOUNTER — Inpatient Hospital Stay (HOSPITAL_COMMUNITY): Payer: BC Managed Care – PPO

## 2013-12-19 MED ORDER — METRONIDAZOLE 500 MG PO TABS
500.0000 mg | ORAL_TABLET | Freq: Three times a day (TID) | ORAL | Status: DC
Start: 2013-12-19 — End: 2014-01-29

## 2013-12-19 MED ORDER — SULFAMETHOXAZOLE-TMP DS 800-160 MG PO TABS: 2.0000 | ORAL_TABLET | Freq: Two times a day (BID) | ORAL | Status: DC

## 2013-12-19 MED ORDER — IOHEXOL 300 MG/ML  SOLN
100.0000 mL | Freq: Once | INTRAMUSCULAR | Status: AC | PRN
  Administered 2013-12-19: 100 mL via INTRAVENOUS

## 2013-12-19 MED ORDER — IOHEXOL 300 MG/ML  SOLN
25.0000 mL | INTRAMUSCULAR | Status: AC
  Administered 2013-12-19: 25 mL via ORAL

## 2013-12-19 NOTE — Progress Notes (Signed)
  Subjective: A little pink vaginal discharge today.  Drinking contrast for CT  Objective: Vital signs in last 24 hours: Temp:  [97.5 F (36.4 C)-98 F (36.7 C)] 97.6 F (36.4 C) (06/04 0610) Pulse Rate:  [68-79] 73 (06/04 0610) Resp:  [16-18] 18 (06/04 0610) BP: (106-121)/(61-68) 113/68 mmHg (06/04 0610) SpO2:  [98 %-100 %] 100 % (06/04 0610) Last BM Date: 12/17/13  Intake/Output from previous day: 06/03 0701 - 06/04 0700 In: 320 [P.O.:120; IV Piggyback:200] Out: 3600 [Urine:3600] Intake/Output this shift:    PE: General- In NAD Abdomen-soft, nontender  Lab Results:   Recent Labs  12/16/13 0941  WBC 7.4  HGB 13.5  HCT 40.6  PLT 332   BMET  Recent Labs  12/16/13 0941  NA 142  K 4.5  CL 102  CO2 28  GLUCOSE 93  BUN 11  CREATININE 0.72  CALCIUM 9.9   PT/INR  Recent Labs  12/16/13 0941  LABPROT 12.6  INR 0.96   Comprehensive Metabolic Panel:    Component Value Date/Time   NA 142 12/16/2013 0941   NA 140 10/31/2013 0904   K 4.5 12/16/2013 0941   K 5.1 10/31/2013 0904   CL 102 12/16/2013 0941   CL 99 10/31/2013 0904   CO2 28 12/16/2013 0941   CO2 27 10/31/2013 0904   BUN 11 12/16/2013 0941   BUN 9 10/31/2013 0904   CREATININE 0.72 12/16/2013 0941   CREATININE 0.71 10/31/2013 0904   GLUCOSE 93 12/16/2013 0941   GLUCOSE 101* 10/31/2013 0904   CALCIUM 9.9 12/16/2013 0941   CALCIUM 9.8 10/31/2013 0904   AST 27 12/16/2013 1230   AST 18 10/31/2013 0904   ALT 17 12/16/2013 1230   ALT 13 10/31/2013 0904   ALKPHOS 81 12/16/2013 1230   ALKPHOS 94 10/31/2013 0904   BILITOT 0.4 12/16/2013 1230   BILITOT 0.7 10/31/2013 0904   PROT 8.0 12/16/2013 1230   PROT 7.8 10/31/2013 0904   ALBUMIN 4.0 12/16/2013 1230   ALBUMIN 3.8 10/31/2013 0904   Abscess culture-abundant coag negative staph  Studies/Results: No results found.  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 CT today.  Check sensitivities.   Chloe Leonard 12/19/2013

## 2013-12-19 NOTE — Discharge Instructions (Signed)
Activities as tolerated.  Low fiber diet.  May return to work on 12/23/13  Call for worsening pain or high fever.  Appointment in 3 weeks.  Please call [phone] to make this appointment.

## 2013-12-19 NOTE — Progress Notes (Signed)
CT scan shows mild inflammatory changes around sigmoid colon.  Abscess between rectum and upper vagina measure 2 cm.  Microbiology lab is run sensitivities on coag negative staph.  I spoke with an ID specialist who recommended Bactrim DS.  She is doing well in the hospital.  Plan on discharging her home on Bactrim DS and Flagyl for 3 weeks.  Will then see her in the office and repeat the CT.

## 2013-12-20 LAB — CULTURE, ROUTINE-ABSCESS

## 2013-12-21 LAB — ANAEROBIC CULTURE

## 2013-12-23 ENCOUNTER — Encounter (INDEPENDENT_AMBULATORY_CARE_PROVIDER_SITE_OTHER): Payer: Self-pay | Admitting: General Surgery

## 2013-12-23 ENCOUNTER — Telehealth (INDEPENDENT_AMBULATORY_CARE_PROVIDER_SITE_OTHER): Payer: Self-pay

## 2013-12-23 ENCOUNTER — Other Ambulatory Visit (INDEPENDENT_AMBULATORY_CARE_PROVIDER_SITE_OTHER): Payer: Self-pay

## 2013-12-23 ENCOUNTER — Other Ambulatory Visit (INDEPENDENT_AMBULATORY_CARE_PROVIDER_SITE_OTHER): Payer: Self-pay | Admitting: General Surgery

## 2013-12-23 MED ORDER — DOXYCYCLINE HYCLATE 100 MG PO CAPS: 100.0000 mg | ORAL_CAPSULE | Freq: Two times a day (BID) | ORAL | Status: DC

## 2013-12-23 NOTE — Telephone Encounter (Signed)
Pt's culture of staph is not sensitive to Bactrim.  Dr. Zella Richer would like to change the Rx to Doxycycline 100mg  tablets, 1 bid, #42 with 1 refill.  Will send to [facility].

## 2013-12-23 NOTE — Progress Notes (Signed)
Patient ID: Chloe Leonard, female   DOB: 1956/04/27, 58 y.o.   MRN: [id number] Coag negative staph is not sensitive to Bactrim but is sensitive to Doxycycline so will switch her to that.

## 2013-12-24 NOTE — Telephone Encounter (Signed)
Patient returned bernie's call and was informed of the information below.  Patient verbalized understanding of the new medication and explained she would pick it up and start it today.

## 2013-12-31 ENCOUNTER — Encounter (INDEPENDENT_AMBULATORY_CARE_PROVIDER_SITE_OTHER): Payer: Self-pay

## 2014-01-08 NOTE — Discharge Summary (Signed)
Physician Discharge Summary  Patient ID: Chloe Leonard MRN: [id number] DOB/AGE: [birth year] 58 y.o.  Admit date: 12/16/2013 Discharge date: 01/08/2014  Admission Diagnoses:  Complicated sigmoid diverticulitis with abscess and possible colovaginal fistula  Discharge Diagnoses:   Same    Discharged Condition: good  Hospital Course: She was admitted and started on intravenous antibiotics. She had been on oral antibiotics for some time but had not had a complete response. Fine-needle aspiration of oily abscesses was performed and this demonstrated a coag-negative staph. Sensitivities were pending at the time of her discharge. I spoke with a infectious disease physician who made recommendations regarding using Bactrim and she was started on that. She had no pain. She did well with her diet. She seemed to get some better radiographically with intravenous antibiotics and she was discharged 12/19/13 on oral antibiotics.  Consults: interventional radiology  Significant Diagnostic Studies: none  Treatments: Fine-needle aspiration of abscess. Intravenous antibiotics.  Discharge Exam: Blood pressure 113/68, pulse 73, temperature 97.6 F (36.4 C), temperature source Oral, resp. rate 18, height 5\' 5"  (1.651 m), weight 171 lb (77.565 kg), SpO2 100.00%.   Disposition: 01-Home or Self Care     Medication List    STOP taking these medications       ciprofloxacin 750 MG tablet  Commonly known as:  CIPRO      TAKE these medications       calcium gluconate 500 MG tablet  Take 1 tablet by mouth 2 (two) times daily.     cholecalciferol 1000 UNITS tablet  Commonly known as:  VITAMIN D  Take 1,000 Units by mouth daily.     fexofenadine 180 MG tablet  Commonly known as:  ALLEGRA  Take 180 mg by mouth at bedtime.     FISH OIL PO  Take 1 capsule by mouth 2 (two) times daily.     fluticasone 50 MCG/ACT nasal spray  Commonly known as:  FLONASE  Place into both nostrils daily.     metroNIDAZOLE 500 MG tablet  Commonly known as:  FLAGYL  Take 500 mg by mouth 2 (two) times daily. Being taking for 3 weeks     metroNIDAZOLE 500 MG tablet  Commonly known as:  FLAGYL  Take 1 tablet (500 mg total) by mouth 3 (three) times daily.     multivitamin tablet  Take 1 tablet by mouth daily with breakfast.         Signed: ROSENBOWER,TODD J 01/08/2014, 7:58 AM

## 2014-01-14 ENCOUNTER — Encounter (INDEPENDENT_AMBULATORY_CARE_PROVIDER_SITE_OTHER): Payer: Self-pay | Admitting: General Surgery

## 2014-01-14 ENCOUNTER — Other Ambulatory Visit (INDEPENDENT_AMBULATORY_CARE_PROVIDER_SITE_OTHER): Payer: Self-pay | Admitting: General Surgery

## 2014-01-14 ENCOUNTER — Ambulatory Visit (INDEPENDENT_AMBULATORY_CARE_PROVIDER_SITE_OTHER): Payer: BC Managed Care – PPO | Admitting: General Surgery

## 2014-01-14 VITALS — BP 126/88 | HR 76 | Temp 98.1°F | Resp 16 | Ht 65.0 in | Wt 170.0 lb

## 2014-01-14 DIAGNOSIS — K5732 Diverticulitis of large intestine without perforation or abscess without bleeding: Secondary | ICD-10-CM

## 2014-01-14 NOTE — Patient Instructions (Addendum)
Continue antibiotics. Do not take Flagyl on an empty stomach. Will coordinate with Dr. Ulanda Edison and schedule your surgery.   [facility]  ONE-DAY (1) PRE-OP HOME COLON PREP INSTRUCTIONS: ** MIRALAX / GATORADE PREP **  Fill the two prescriptions at a pharmacy of your choice.  You must follow the instructions below carefully.  If you have questions or problems, please call and speak to someone in the clinic department at our office:   [phone].  MIRALAX - GATORADE -- DULCOLAX TABS:   Fill the prescriptions for MIRALAX  (255 gm bottle)    In addition, purchase four (4) DULCOLAX TABLETS (no prescription required), and one 64 oz GATORADE.  (Do NOT purchase red Gatorade; any other flavor is acceptable).    INSTRUCTIONS: 1. Five days prior to your procedure do not eat nuts, popcorn, or fruit with seeds.  Stop all fiber supplements such as Metamucil, Citrucel, etc.  2. The day before your procedure: o 6:00am:  take (4) Dulcolax tablets.  You should remain on clear liquids for the entire day.   CLEAR LIQUIDS: clear bouillon, broth, jello (NOT RED), black coffee, tea, soda, etc o 10:00am:  add the bottle of MiraLax to the 64-oz bottle of Gatorade, and dissolve.  Begin drinking the Gatorade mixture until gone (8 oz every 15-30 minutes).  Continue clear liquids until midnight (or bedtime). o Take the antibiotics at the times instructed on the bottles.  3. The day of your procedure:   Do not eat or drink ANYTHING after midnight before your surgery.     If you take Heart or Blood Pressure medicine, ask the pre-op nurses about these during your preop appointment.   Further pre-operative instructions will be given to you from the hospital.   Expect to be contacted 5-7 days before your surgery.

## 2014-01-14 NOTE — Progress Notes (Signed)
Patient ID: Chloe Leonard, female   DOB: Mar 09, 1956, 58 y.o.   MRN: [id number]  Chief Complaint  Patient presents with  . Routine Post Op    reck after diverticulitis    HPI Chloe Leonard is a 58 y.o. female.   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 She was doing well until this past weekend when she began having a headache and not feeling well. She then had greenish-brown vaginal discharge and decreased stool caliber. No fever or chills. She has been on doxycycline and Flagyl. She gets some nausea from the Flagyl.  Past Medical History  Diagnosis Date  . Arthritis   . Diverticulitis     Past Surgical History  Procedure Laterality Date  . Breast surgery    . Carpal tunnel release  2001  . Pelvic laparoscopy  04/04/2002  . Knee arthroscopy  07/2003    Family History  Problem Relation Age of Onset  . Cancer Father     melanoma    Social History History  Substance Use Topics  . Smoking status: Never Smoker   . Smokeless tobacco: Never Used  . Alcohol Use: No    Allergies  Allergen Reactions  . Augmentin [Amoxicillin-Pot Clavulanate] Nausea And Vomiting  . Cortisone Other (See Comments)    Lips swell and face flushes     Current Outpatient Prescriptions  Medication Sig Dispense Refill  . doxycycline (VIBRAMYCIN) 100 MG capsule Take 1 capsule (100 mg total) by mouth 2 (two) times daily.  42 capsule  1  . fexofenadine (ALLEGRA) 180 MG tablet Take 180 mg by mouth at bedtime.       . fluticasone (FLONASE) 50 MCG/ACT nasal spray Place into both nostrils daily.      . metroNIDAZOLE (FLAGYL) 500 MG tablet Take 1 tablet (500 mg total) by mouth 3 (three) times daily.  60 tablet  2   No current facility-administered medications for this visit.    Review of Systems Review of Systems  Constitutional: Negative for fever and chills.  Gastrointestinal: Positive for nausea and abdominal pain.  Genitourinary: Positive for  vaginal bleeding and vaginal discharge.    Blood pressure 126/88, pulse 76, temperature 98.1 F (36.7 C), temperature source Oral, resp. rate 16, height 5\' 5"  (1.651 m), weight 170 lb (77.111 kg).  Physical Exam Physical Exam  Constitutional: She appears well-developed and well-nourished. No distress.  HENT:  Head: Normocephalic and atraumatic.  Cardiovascular: Normal rate and regular rhythm.   Pulmonary/Chest: Effort normal and breath sounds normal.  Abdominal: She exhibits no distension and no mass. There is no tenderness.  Neurological: She is alert.  Skin: Skin is warm and dry.  Psychiatric: She has a normal mood and affect. Her behavior is normal.    Data Reviewed Previous note.  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 She was doing well until a few days ago when she had recurrence of the vaginal discharge and some of her symptoms.  This situation does not appear to be resolving despite long-term antibiotics.    Plan    Repeat CT scan. I discussed with her that I felt she's going to need to have a laparoscopic assisted partial colectomy and then repair of a colovaginal fistula by Dr. Ulanda Edison at the same time. I told her there was a chance that she may need a colostomy or if she had a primary anastomosis, a protective loop ileostomy versus a primary  anastomosis alone. I have explained the procedure and risks of colon resection.  Risks include but are not limited to bleeding, infection, wound problems, anesthesia, anastomotic leak, need for colostomy, injury to intraabominal organs (such as intestine, spleen, kidney, bladder, ureter, etc.), ileus, irregular bowel habits.  She seems to understand and agrees with the plan.        ROSENBOWER,TODD J 01/14/2014, 5:36 PM

## 2014-01-23 ENCOUNTER — Ambulatory Visit
Admission: RE | Admit: 2014-01-23 | Discharge: 2014-01-23 | Disposition: A | Discharge status: Home / Self Care | Payer: BC Managed Care – PPO | Source: Ambulatory Visit | Attending: General Surgery | Admitting: General Surgery

## 2014-01-23 DIAGNOSIS — K5732 Diverticulitis of large intestine without perforation or abscess without bleeding: Secondary | ICD-10-CM

## 2014-01-23 MED ORDER — IOHEXOL 300 MG/ML  SOLN
100.0000 mL | Freq: Once | INTRAMUSCULAR | Status: AC | PRN
  Administered 2014-01-23: 100 mL via INTRAVENOUS

## 2014-01-27 ENCOUNTER — Encounter (INDEPENDENT_AMBULATORY_CARE_PROVIDER_SITE_OTHER): Payer: Self-pay | Admitting: General Surgery

## 2014-01-29 ENCOUNTER — Other Ambulatory Visit (INDEPENDENT_AMBULATORY_CARE_PROVIDER_SITE_OTHER): Payer: Self-pay | Admitting: General Surgery

## 2014-01-29 ENCOUNTER — Encounter (INDEPENDENT_AMBULATORY_CARE_PROVIDER_SITE_OTHER): Payer: Self-pay | Admitting: General Surgery

## 2014-01-29 MED ORDER — METRONIDAZOLE 500 MG PO TABS: 500.0000 mg | ORAL_TABLET | Freq: Three times a day (TID) | ORAL | Status: DC

## 2014-01-29 MED ORDER — DOXYCYCLINE HYCLATE 100 MG PO CAPS: 100.0000 mg | ORAL_CAPSULE | Freq: Two times a day (BID) | ORAL | Status: DC

## 2014-01-29 NOTE — Progress Notes (Signed)
Patient ID: Chloe Leonard, female   DOB: 06/01/1956, 58 y.o.   MRN: [id number] I reviewed the CT scan reports with her (printed below).  She continues to have episodes of vagina discharge which is likely the abscess between the rectosigmoid and the vagina spontaneously draining through the fistula.  Surgery has been schedule for 8/7.  She will stay on the antibiotics until that time.  We have [place] nurse mark her for a descending colostomy and loop ileostomy.

## 2014-02-12 ENCOUNTER — Encounter (HOSPITAL_COMMUNITY): Payer: Self-pay | Admitting: Pharmacy Technician

## 2014-02-17 NOTE — Patient Instructions (Signed)
[place]  02/17/2014   Your procedure is scheduled on: Friday 02/21/14  Report to [facility] at 08:30 AM.  Call this number if you have problems the morning of surgery [phone]: [phone]   Remember: please follow all bowel prep instructions   Do not eat food or drink liquids After Midnight.     Take these medicines the morning of surgery with A SIP OF WATER: doxycycline, allegra, flonase if needed, flagyl   Do not wear jewelry, make-up or nail polish.  Do not wear lotions, powders, or perfumes. You may wear deodorant.  Do not shave 48 hours prior to surgery. Men may shave face and neck.  Do not bring valuables to the hospital.  Contacts, dentures or bridgework may not be worn into surgery.  Leave suitcase in the car. After surgery it may be brought to your room.  For patients admitted to the hospital, checkout time is 11:00 AM the day of discharge.    Paulette Blanch, RN  pre op nurse call if needed [phone]    [facility] - Preparing for Surgery Before surgery, you can play an important role.  Because skin is not sterile, your skin needs to be as free of germs as possible.  You can reduce the number of germs on your skin by washing with CHG (chlorahexidine gluconate) soap before surgery.  CHG is an antiseptic cleaner which kills germs and bonds with the skin to continue killing germs even after washing. Please DO NOT use if you have an allergy to CHG or antibacterial soaps.  If your skin becomes reddened/irritated stop using the CHG and inform your nurse when you arrive at Short Stay. Do not shave (including legs and underarms) for at least 48 hours prior to the first CHG shower.  You may shave your face/neck. Please follow these instructions carefully:  1.  Shower with CHG Soap the night before surgery and the  morning of Surgery.  2.  If you choose to wash your hair, wash your hair first as usual with your  normal  shampoo.  3.  After you shampoo, rinse your  hair and body thoroughly to remove the  shampoo.                            4.  Use CHG as you would any other liquid soap.  You can apply chg directly  to the skin and wash                       Gently with a scrungie or clean washcloth.  5.  Apply the CHG Soap to your body ONLY FROM THE NECK DOWN.   Do not use on face/ open                           Wound or open sores. Avoid contact with eyes, ears mouth and genitals (private parts).                       Wash face,  Genitals (private parts) with your normal soap.             6.  Wash thoroughly, paying special attention to the area where your surgery  will be performed.  7.  Thoroughly rinse your body with warm water from the neck down.  8.  DO NOT  shower/wash with your normal soap after using and rinsing off  the CHG Soap.                9.  Pat yourself dry with a clean towel.            10.  Wear clean pajamas.            11.  Place clean sheets on your bed the night of your first shower and do not  sleep with pets. Day of Surgery : Do not apply any lotions/deodorants the morning of surgery.  Please wear clean clothes to the hospital/surgery center.  FAILURE TO FOLLOW THESE INSTRUCTIONS MAY RESULT IN THE CANCELLATION OF YOUR SURGERY PATIENT SIGNATURE_________________________________  NURSE SIGNATURE__________________________________  ________________________________________________________________________  WHAT IS A BLOOD TRANSFUSION? Blood Transfusion Information  A transfusion is the replacement of blood or some of its parts. Blood is made up of multiple cells which provide different functions.  Red blood cells carry oxygen and are used for blood loss replacement.  White blood cells fight against infection.  Platelets control bleeding.  Plasma helps clot blood.  Other blood products are available for specialized needs, such as hemophilia or other clotting disorders. BEFORE THE TRANSFUSION  Who gives blood for  transfusions?   Healthy volunteers who are fully evaluated to make sure their blood is safe. This is blood bank blood. Transfusion therapy is the safest it has ever been in the practice of medicine. Before blood is taken from a donor, a complete history is taken to make sure that person has no history of diseases nor engages in risky social behavior (examples are intravenous drug use or sexual activity with multiple partners). The donor's travel history is screened to minimize risk of transmitting infections, such as malaria. The donated blood is tested for signs of infectious diseases, such as HIV and hepatitis. The blood is then tested to be sure it is compatible with you in order to minimize the chance of a transfusion reaction. If you or a relative donates blood, this is often done in anticipation of surgery and is not appropriate for emergency situations. It takes many days to process the donated blood. RISKS AND COMPLICATIONS Although transfusion therapy is very safe and saves many lives, the main dangers of transfusion include:   Getting an infectious disease.  Developing a transfusion reaction. This is an allergic reaction to something in the blood you were given. Every precaution is taken to prevent this. The decision to have a blood transfusion has been considered carefully by your caregiver before blood is given. Blood is not given unless the benefits outweigh the risks. AFTER THE TRANSFUSION  Right after receiving a blood transfusion, you will usually feel much better and more energetic. This is especially true if your red blood cells have gotten low (anemic). The transfusion raises the level of the red blood cells which carry oxygen, and this usually causes an energy increase.  The nurse administering the transfusion will monitor you carefully for complications. HOME CARE INSTRUCTIONS  No special instructions are needed after a transfusion. You may find your energy is better. Speak with  your caregiver about any limitations on activity for underlying diseases you may have. SEEK MEDICAL CARE IF:   Your condition is not improving after your transfusion.  You develop redness or irritation at the intravenous (IV) site. SEEK IMMEDIATE MEDICAL CARE IF:  Any of the following symptoms occur over the next 12 hours:  Shaking chills.  You have a  temperature by mouth above 102 F (38.9 C), not controlled by medicine.  Chest, back, or muscle pain.  People around you feel you are not acting correctly or are confused.  Shortness of breath or difficulty breathing.  Dizziness and fainting.  You get a rash or develop hives.  You have a decrease in urine output.  Your urine turns a dark color or changes to pink, red, or brown. Any of the following symptoms occur over the next 10 days:  You have a temperature by mouth above 102 F (38.9 C), not controlled by medicine.  Shortness of breath.  Weakness after normal activity.  The white part of the eye turns yellow (jaundice).  You have a decrease in the amount of urine or are urinating less often.  Your urine turns a dark color or changes to pink, red, or brown. Document Released: 07/01/2000 Document Revised: 09/26/2011 Document Reviewed: 02/18/2008 [facility] Patient Information 2014 [place], [place].  _______________________________________________________________________

## 2014-02-18 ENCOUNTER — Encounter (HOSPITAL_COMMUNITY)
Admission: RE | Admit: 2014-02-18 | Discharge: 2014-02-18 | Disposition: A | Discharge status: Home / Self Care | Payer: BC Managed Care – PPO | Source: Ambulatory Visit | Attending: General Surgery | Admitting: General Surgery

## 2014-02-18 ENCOUNTER — Encounter (HOSPITAL_COMMUNITY): Payer: Self-pay

## 2014-02-18 DIAGNOSIS — Z01812 Encounter for preprocedural laboratory examination: Secondary | ICD-10-CM | POA: Insufficient documentation

## 2014-02-18 DIAGNOSIS — Z01818 Encounter for other preprocedural examination: Secondary | ICD-10-CM

## 2014-02-18 HISTORY — DX: Unspecified osteoarthritis, unspecified site: M19.90

## 2014-02-18 HISTORY — DX: Other complications of anesthesia, initial encounter: T88.59XA

## 2014-02-18 HISTORY — DX: Adverse effect of unspecified anesthetic, initial encounter: T41.45XA

## 2014-02-18 LAB — CBC WITH DIFFERENTIAL/PLATELET
Basophils Absolute: 0 K/uL (ref 0.0–0.1)
Basophils Relative: 0 % (ref 0–1)
Eosinophils Absolute: 0.1 K/uL (ref 0.0–0.7)
Eosinophils Relative: 1 % (ref 0–5)
HCT: 40 % (ref 36.0–46.0)
Hemoglobin: 13.1 g/dL (ref 12.0–15.0)
Lymphocytes Relative: 26 % (ref 12–46)
Lymphs Abs: 1.9 K/uL (ref 0.7–4.0)
MCH: 29.8 pg (ref 26.0–34.0)
MCHC: 32.8 g/dL (ref 30.0–36.0)
MCV: 91.1 fL (ref 78.0–100.0)
Monocytes Absolute: 0.5 K/uL (ref 0.1–1.0)
Monocytes Relative: 7 % (ref 3–12)
Neutro Abs: 4.8 K/uL (ref 1.7–7.7)
Neutrophils Relative %: 66 % (ref 43–77)
Platelets: 267 K/uL (ref 150–400)
RBC: 4.39 MIL/uL (ref 3.87–5.11)
RDW: 14 % (ref 11.5–15.5)
WBC: 7.3 K/uL (ref 4.0–10.5)

## 2014-02-18 LAB — COMPREHENSIVE METABOLIC PANEL
ALT: 15 U/L (ref 0–35)
AST: 19 U/L (ref 0–37)
Albumin: 3.4 g/dL — ABNORMAL LOW (ref 3.5–5.2)
Alkaline Phosphatase: 75 U/L (ref 39–117)
Anion gap: 11 (ref 5–15)
BUN: 11 mg/dL (ref 6–23)
CO2: 27 mEq/L (ref 19–32)
Calcium: 9.1 mg/dL (ref 8.4–10.5)
Chloride: 101 mEq/L (ref 96–112)
Creatinine, Ser: 0.6 mg/dL (ref 0.50–1.10)
GFR calc Af Amer: >90 mL/min (ref >90)
GFR calc non Af Amer: >90 mL/min (ref >90)
Glucose, Bld: 86 mg/dL (ref 70–99)
Potassium: 4.3 mEq/L (ref 3.7–5.3)
Sodium: 139 mEq/L (ref 137–147)
Total Bilirubin: 0.2 mg/dL — ABNORMAL LOW (ref 0.3–1.2)
Total Protein: 7.2 g/dL (ref 6.0–8.3)

## 2014-02-18 LAB — TYPE AND SCREEN
ABO/RH(D): B POS
Antibody Screen: NEGATIVE

## 2014-02-18 LAB — PROTIME-INR
INR: 0.99 (ref 0.00–1.49)
Prothrombin Time: 13.1 seconds (ref 11.6–15.2)

## 2014-02-18 LAB — ABO/RH: ABO/RH(D): B POS

## 2014-02-18 NOTE — Consult Note (Signed)
WOC ostomy consult note Patient is seen per Dr. Bertrum Sol request for assessment and preoperative stoma site selection.  He asks for both a siting for a loop ileostomy and a descending colostomy.  Patient is assessed in a standing and sitting position.  Site for the loop ileostomy is located 6cm to the right of the umbilicus and 7.1QR below.  Site for the descending colostomy is located 5.5cm to the left of the umbilicus and 3cm below. Elta Guadeloupe is made with a surgical site skin marking pen and covered with a thin film transparent dressing. Education provided: Patient is provided with a sample ostomy pouching system as she has not seen one in the past.  She is hopeful that no stoma is necessary, but if one is made that it might be temporary.  She knows she can deal with a permanent stoma if that is what is needed.  Understands the role of the ostomy nurse specialist in aftercare should a stoma be created. Has a strong faith and has other family supports who will be with her here at the hospital.   Thank you for allowing me to meet this patient and assist with marking for possible stoma creation. [place] nursing team will remain available to this patient, the nursing, surgical and medical team.  Please re-consult if stoma created in surgery on 02/21/14. Maudie Flakes, MSN, RN, [place], [place], [place] [phone])

## 2014-02-21 ENCOUNTER — Encounter (HOSPITAL_COMMUNITY)
Admission: RE | Disposition: A | Discharge status: Home Health | Payer: Self-pay | Source: Ambulatory Visit | Attending: General Surgery

## 2014-02-21 ENCOUNTER — Encounter (HOSPITAL_COMMUNITY): Payer: Self-pay | Admitting: *Deleted

## 2014-02-21 ENCOUNTER — Inpatient Hospital Stay (HOSPITAL_COMMUNITY): Payer: BC Managed Care – PPO | Admitting: *Deleted

## 2014-02-21 ENCOUNTER — Encounter (HOSPITAL_COMMUNITY): Payer: BC Managed Care – PPO | Admitting: *Deleted

## 2014-02-21 ENCOUNTER — Inpatient Hospital Stay (HOSPITAL_COMMUNITY)
Admission: RE | Admit: 2014-02-21 | Discharge: 2014-03-03 | DRG: 330 | Disposition: A | Discharge status: Home Health | Payer: BC Managed Care – PPO | Source: Ambulatory Visit | Attending: General Surgery | Admitting: General Surgery

## 2014-02-21 DIAGNOSIS — Z881 Allergy status to other antibiotic agents status: Secondary | ICD-10-CM | POA: Diagnosis not present

## 2014-02-21 DIAGNOSIS — K560 Paralytic ileus: Secondary | ICD-10-CM | POA: Diagnosis not present

## 2014-02-21 DIAGNOSIS — Z01812 Encounter for preprocedural laboratory examination: Secondary | ICD-10-CM

## 2014-02-21 DIAGNOSIS — M1990 Unspecified osteoarthritis, unspecified site: Secondary | ICD-10-CM | POA: Diagnosis present

## 2014-02-21 DIAGNOSIS — Z808 Family history of malignant neoplasm of other organs or systems: Secondary | ICD-10-CM | POA: Diagnosis not present

## 2014-02-21 DIAGNOSIS — N824 Other female intestinal-genital tract fistulae: Secondary | ICD-10-CM | POA: Diagnosis present

## 2014-02-21 DIAGNOSIS — K929 Disease of digestive system, unspecified: Secondary | ICD-10-CM | POA: Diagnosis not present

## 2014-02-21 DIAGNOSIS — Y836 Removal of other organ (partial) (total) as the cause of abnormal reaction of the patient, or of later complication, without mention of misadventure at the time of the procedure: Secondary | ICD-10-CM | POA: Diagnosis not present

## 2014-02-21 DIAGNOSIS — Z888 Allergy status to other drugs, medicaments and biological substances status: Secondary | ICD-10-CM

## 2014-02-21 DIAGNOSIS — K5732 Diverticulitis of large intestine without perforation or abscess without bleeding: Principal | ICD-10-CM | POA: Diagnosis present

## 2014-02-21 DIAGNOSIS — N898 Other specified noninflammatory disorders of vagina: Secondary | ICD-10-CM | POA: Diagnosis present

## 2014-02-21 DIAGNOSIS — N731 Chronic parametritis and pelvic cellulitis: Secondary | ICD-10-CM | POA: Diagnosis present

## 2014-02-21 DIAGNOSIS — Z79899 Other long term (current) drug therapy: Secondary | ICD-10-CM

## 2014-02-21 DIAGNOSIS — Z792 Long term (current) use of antibiotics: Secondary | ICD-10-CM

## 2014-02-21 HISTORY — PX: LAPAROSCOPIC PARTIAL COLECTOMY: SHX5907

## 2014-02-21 SURGERY — LAPAROSCOPIC PARTIAL COLECTOMY
Anesthesia: General | Site: Abdomen

## 2014-02-21 MED ORDER — FENTANYL CITRATE 0.05 MG/ML IJ SOLN
INTRAMUSCULAR | Status: AC
  Filled 2014-02-21: qty 5

## 2014-02-21 MED ORDER — MORPHINE SULFATE 10 MG/ML IJ SOLN
1.0000 mg | INTRAMUSCULAR | Status: DC | PRN
  Administered 2014-02-21 (×5): 2 mg via INTRAVENOUS

## 2014-02-21 MED ORDER — LIP MEDEX EX OINT
TOPICAL_OINTMENT | CUTANEOUS | Status: DC | PRN
  Administered 2014-02-21: 1 via TOPICAL
  Administered 2014-02-27: 22:00:00 via TOPICAL
  Filled 2014-02-21: qty 7

## 2014-02-21 MED ORDER — PROPOFOL 10 MG/ML IV BOLUS
INTRAVENOUS | Status: AC
  Filled 2014-02-21: qty 20

## 2014-02-21 MED ORDER — PROMETHAZINE HCL 25 MG/ML IJ SOLN
INTRAMUSCULAR | Status: AC
  Filled 2014-02-21: qty 1

## 2014-02-21 MED ORDER — BUPIVACAINE HCL (PF) 0.5 % IJ SOLN
INTRAMUSCULAR | Status: AC
  Filled 2014-02-21: qty 30

## 2014-02-21 MED ORDER — ONDANSETRON HCL 4 MG/2ML IJ SOLN
4.0000 mg | INTRAMUSCULAR | Status: DC | PRN
  Administered 2014-02-23 – 2014-02-27 (×8): 4 mg via INTRAVENOUS
  Filled 2014-02-21 (×8): qty 2

## 2014-02-21 MED ORDER — KETAMINE HCL 10 MG/ML IJ SOLN
INTRAMUSCULAR | Status: AC
  Filled 2014-02-21: qty 1

## 2014-02-21 MED ORDER — ONDANSETRON HCL 4 MG/2ML IJ SOLN: 4.0000 mg | Freq: Four times a day (QID) | INTRAMUSCULAR | Status: DC | PRN

## 2014-02-21 MED ORDER — ALVIMOPAN 12 MG PO CAPS: 12.0000 mg | ORAL_CAPSULE | Freq: Two times a day (BID) | ORAL | Status: DC

## 2014-02-21 MED ORDER — DEXAMETHASONE SODIUM PHOSPHATE 10 MG/ML IJ SOLN
INTRAMUSCULAR | Status: AC
  Filled 2014-02-21: qty 1

## 2014-02-21 MED ORDER — NEOSTIGMINE METHYLSULFATE 10 MG/10ML IV SOLN
INTRAVENOUS | Status: DC | PRN
  Administered 2014-02-21 (×5): 1 mg via INTRAVENOUS

## 2014-02-21 MED ORDER — KCL-LACTATED RINGERS-D5W 20 MEQ/L IV SOLN
INTRAVENOUS | Status: DC
  Administered 2014-02-21 – 2014-02-22 (×2): 125 mL/h via INTRAVENOUS
  Administered 2014-02-22: via INTRAVENOUS
  Administered 2014-02-23: 125 mL/h via INTRAVENOUS
  Administered 2014-02-23 – 2014-02-24 (×2): via INTRAVENOUS
  Administered 2014-02-25: 125 mL/h via INTRAVENOUS
  Filled 2014-02-21 (×13): qty 1000

## 2014-02-21 MED ORDER — CEFOTETAN DISODIUM-DEXTROSE 2-2.08 GM-% IV SOLR
2.0000 g | Freq: Once | INTRAVENOUS | Status: AC
  Administered 2014-02-21: 2 g via INTRAVENOUS

## 2014-02-21 MED ORDER — FENTANYL CITRATE 0.05 MG/ML IJ SOLN
INTRAMUSCULAR | Status: AC
  Filled 2014-02-21: qty 2

## 2014-02-21 MED ORDER — PANTOPRAZOLE SODIUM 40 MG IV SOLR
40.0000 mg | INTRAVENOUS | Status: DC
Start: 2014-02-21 — End: 2014-02-26
  Administered 2014-02-21 – 2014-02-25 (×5): 40 mg via INTRAVENOUS
  Filled 2014-02-21 (×6): qty 40

## 2014-02-21 MED ORDER — MIDAZOLAM HCL 5 MG/5ML IJ SOLN
INTRAMUSCULAR | Status: DC | PRN
  Administered 2014-02-21: 2 mg via INTRAVENOUS

## 2014-02-21 MED ORDER — KETAMINE HCL 10 MG/ML IJ SOLN
INTRAMUSCULAR | Status: DC | PRN
  Administered 2014-02-21 (×2): 10 mg via INTRAVENOUS

## 2014-02-21 MED ORDER — MORPHINE SULFATE (PF) 1 MG/ML IV SOLN
INTRAVENOUS | Status: AC
  Filled 2014-02-21: qty 25

## 2014-02-21 MED ORDER — FENTANYL CITRATE 0.05 MG/ML IJ SOLN
25.0000 ug | INTRAMUSCULAR | Status: DC | PRN
  Administered 2014-02-21: 50 ug via INTRAVENOUS

## 2014-02-21 MED ORDER — LACTATED RINGERS IV SOLN
INTRAVENOUS | Status: DC | PRN
  Administered 2014-02-21: 1000 mL

## 2014-02-21 MED ORDER — ONDANSETRON HCL 4 MG/2ML IJ SOLN
INTRAMUSCULAR | Status: DC | PRN
  Administered 2014-02-21: 4 mg via INTRAVENOUS

## 2014-02-21 MED ORDER — HYDROMORPHONE HCL PF 1 MG/ML IJ SOLN
INTRAMUSCULAR | Status: AC
  Filled 2014-02-21: qty 1

## 2014-02-21 MED ORDER — BUPIVACAINE HCL (PF) 0.5 % IJ SOLN
INTRAMUSCULAR | Status: DC | PRN
  Administered 2014-02-21: 10 mL

## 2014-02-21 MED ORDER — MIDAZOLAM HCL 2 MG/2ML IJ SOLN
INTRAMUSCULAR | Status: AC
  Filled 2014-02-21: qty 2

## 2014-02-21 MED ORDER — DIPHENHYDRAMINE HCL 50 MG/ML IJ SOLN: 12.5000 mg | Freq: Four times a day (QID) | INTRAMUSCULAR | Status: DC | PRN

## 2014-02-21 MED ORDER — HYDROMORPHONE HCL PF 1 MG/ML IJ SOLN
0.2500 mg | INTRAMUSCULAR | Status: DC | PRN
  Administered 2014-02-21 (×4): 0.5 mg via INTRAVENOUS

## 2014-02-21 MED ORDER — LACTATED RINGERS IV SOLN
INTRAVENOUS | Status: DC
  Administered 2014-02-21 (×3): via INTRAVENOUS
  Administered 2014-02-21: 1000 mL via INTRAVENOUS

## 2014-02-21 MED ORDER — DOXYCYCLINE HYCLATE 100 MG IV SOLR
100.0000 mg | Freq: Two times a day (BID) | INTRAVENOUS | Status: DC
  Administered 2014-02-21 – 2014-02-28 (×14): 100 mg via INTRAVENOUS
  Filled 2014-02-21 (×14): qty 100

## 2014-02-21 MED ORDER — HEPARIN SODIUM (PORCINE) 5000 UNIT/ML IJ SOLN
5000.0000 [IU] | Freq: Three times a day (TID) | INTRAMUSCULAR | Status: DC
  Administered 2014-02-22 – 2014-03-02 (×25): 5000 [IU] via SUBCUTANEOUS
  Filled 2014-02-21 (×30): qty 1

## 2014-02-21 MED ORDER — NALOXONE HCL 0.4 MG/ML IJ SOLN: 0.4000 mg | INTRAMUSCULAR | Status: DC | PRN

## 2014-02-21 MED ORDER — DIPHENHYDRAMINE HCL 12.5 MG/5ML PO ELIX: 12.5000 mg | ORAL_SOLUTION | Freq: Four times a day (QID) | ORAL | Status: DC | PRN

## 2014-02-21 MED ORDER — ONDANSETRON HCL 4 MG PO TABS: 4.0000 mg | ORAL_TABLET | Freq: Four times a day (QID) | ORAL | Status: DC | PRN

## 2014-02-21 MED ORDER — GLYCOPYRROLATE 0.2 MG/ML IJ SOLN
INTRAMUSCULAR | Status: DC | PRN
  Administered 2014-02-21: 0.2 mg via INTRAVENOUS
  Administered 2014-02-21 (×4): 0.1 mg via INTRAVENOUS

## 2014-02-21 MED ORDER — SODIUM CHLORIDE 0.9 % IJ SOLN: 9.0000 mL | INTRAMUSCULAR | Status: DC | PRN

## 2014-02-21 MED ORDER — ROCURONIUM BROMIDE 100 MG/10ML IV SOLN
INTRAVENOUS | Status: DC | PRN
  Administered 2014-02-21 (×4): 10 mg via INTRAVENOUS
  Administered 2014-02-21: 40 mg via INTRAVENOUS

## 2014-02-21 MED ORDER — 0.9 % SODIUM CHLORIDE (POUR BTL) OPTIME
TOPICAL | Status: DC | PRN
  Administered 2014-02-21: 2000 mL
  Administered 2014-02-21: 1000 mL

## 2014-02-21 MED ORDER — GLYCOPYRROLATE 0.2 MG/ML IJ SOLN
INTRAMUSCULAR | Status: AC
  Filled 2014-02-21: qty 3

## 2014-02-21 MED ORDER — ROCURONIUM BROMIDE 100 MG/10ML IV SOLN
INTRAVENOUS | Status: AC
  Filled 2014-02-21: qty 1

## 2014-02-21 MED ORDER — FENTANYL CITRATE 0.05 MG/ML IJ SOLN
INTRAMUSCULAR | Status: DC | PRN
  Administered 2014-02-21 (×5): 50 ug via INTRAVENOUS
  Administered 2014-02-21: 100 ug via INTRAVENOUS
  Administered 2014-02-21: 50 ug via INTRAVENOUS

## 2014-02-21 MED ORDER — ONDANSETRON HCL 4 MG/2ML IJ SOLN
INTRAMUSCULAR | Status: AC
  Filled 2014-02-21: qty 2

## 2014-02-21 MED ORDER — METOCLOPRAMIDE HCL 5 MG/ML IJ SOLN
INTRAMUSCULAR | Status: AC
  Filled 2014-02-21: qty 2

## 2014-02-21 MED ORDER — PHENYLEPHRINE 40 MCG/ML (10ML) SYRINGE FOR IV PUSH (FOR BLOOD PRESSURE SUPPORT)
PREFILLED_SYRINGE | INTRAVENOUS | Status: AC
Start: 2014-02-21 — End: 2014-02-21
  Filled 2014-02-21: qty 10

## 2014-02-21 MED ORDER — CEFOTETAN DISODIUM-DEXTROSE 2-2.08 GM-% IV SOLR
INTRAVENOUS | Status: AC
  Filled 2014-02-21: qty 50

## 2014-02-21 MED ORDER — MORPHINE SULFATE 10 MG/ML IJ SOLN
INTRAMUSCULAR | Status: AC
  Filled 2014-02-21: qty 1

## 2014-02-21 MED ORDER — METOCLOPRAMIDE HCL 5 MG/ML IJ SOLN
INTRAMUSCULAR | Status: DC | PRN
  Administered 2014-02-21: 10 mg via INTRAVENOUS

## 2014-02-21 MED ORDER — PROMETHAZINE HCL 25 MG/ML IJ SOLN
6.2500 mg | INTRAMUSCULAR | Status: AC | PRN
  Administered 2014-02-21 (×2): 12.5 mg via INTRAVENOUS

## 2014-02-21 MED ORDER — NEOSTIGMINE METHYLSULFATE 10 MG/10ML IV SOLN
INTRAVENOUS | Status: AC
  Filled 2014-02-21: qty 1

## 2014-02-21 MED ORDER — METRONIDAZOLE IN NACL 5-0.79 MG/ML-% IV SOLN
500.0000 mg | Freq: Three times a day (TID) | INTRAVENOUS | Status: DC
  Administered 2014-02-21 – 2014-02-28 (×20): 500 mg via INTRAVENOUS
  Filled 2014-02-21 (×23): qty 100

## 2014-02-21 MED ORDER — PHENYLEPHRINE HCL 10 MG/ML IJ SOLN
INTRAMUSCULAR | Status: DC | PRN
  Administered 2014-02-21 (×3): 40 ug via INTRAVENOUS

## 2014-02-21 MED ORDER — MORPHINE SULFATE (PF) 1 MG/ML IV SOLN
INTRAVENOUS | Status: DC
  Administered 2014-02-21: 15:00:00 via INTRAVENOUS
  Administered 2014-02-21: 13.5 mg via INTRAVENOUS
  Administered 2014-02-22: 2.84 mg via INTRAVENOUS
  Administered 2014-02-22: 7.5 mg via INTRAVENOUS
  Administered 2014-02-22: 17:00:00 via INTRAVENOUS
  Administered 2014-02-22: 13.9 mg via INTRAVENOUS
  Administered 2014-02-22: 25.34 mg via INTRAVENOUS
  Administered 2014-02-22: 10.49 mg via INTRAVENOUS
  Administered 2014-02-22: 07:00:00 via INTRAVENOUS
  Administered 2014-02-22: 3 mg via INTRAVENOUS
  Administered 2014-02-23: 6 mg via INTRAVENOUS
  Administered 2014-02-23: 4.5 mg via INTRAVENOUS
  Administered 2014-02-23: 10.5 mg via INTRAVENOUS
  Administered 2014-02-23 (×2): via INTRAVENOUS
  Administered 2014-02-23: 10.5 mg via INTRAVENOUS
  Administered 2014-02-23 (×2): 6 mg via INTRAVENOUS
  Administered 2014-02-23 – 2014-02-24 (×2): via INTRAVENOUS
  Administered 2014-02-24: 6 mg via INTRAVENOUS
  Administered 2014-02-24: 10.5 mg via INTRAVENOUS
  Administered 2014-02-24: 3 mg via INTRAVENOUS
  Administered 2014-02-25: 9 mg via INTRAVENOUS
  Administered 2014-02-25: 08:00:00 via INTRAVENOUS
  Administered 2014-02-25 (×2): 3 mg via INTRAVENOUS
  Administered 2014-02-25: 4.5 mg via INTRAVENOUS
  Administered 2014-02-25: 6 mg via INTRAVENOUS
  Administered 2014-02-25: 4.5 mg via INTRAVENOUS
  Administered 2014-02-25: 6 mg via INTRAVENOUS
  Administered 2014-02-25: 3 mg via INTRAVENOUS
  Administered 2014-02-26: 4.5 mg via INTRAVENOUS
  Administered 2014-02-26: 7.5 mg via INTRAVENOUS
  Filled 2014-02-21 (×9): qty 25

## 2014-02-21 MED ORDER — ALVIMOPAN 12 MG PO CAPS
12.0000 mg | ORAL_CAPSULE | Freq: Once | ORAL | Status: AC
  Administered 2014-02-21: 12 mg via ORAL
  Filled 2014-02-21: qty 1

## 2014-02-21 MED ORDER — PHENYLEPHRINE 40 MCG/ML (10ML) SYRINGE FOR IV PUSH (FOR BLOOD PRESSURE SUPPORT)
PREFILLED_SYRINGE | INTRAVENOUS | Status: AC
  Filled 2014-02-21: qty 10

## 2014-02-21 MED ORDER — PROPOFOL 10 MG/ML IV BOLUS
INTRAVENOUS | Status: DC | PRN
  Administered 2014-02-21: 120 mg via INTRAVENOUS

## 2014-02-21 SURGICAL SUPPLY — 88 items
APPLIER CLIP 5 13 M/L LIGAMAX5 (MISCELLANEOUS) ×2
APPLIER CLIP ROT 10 11.4 M/L (STAPLE)
APR CLP MED LRG 11.4X10 (STAPLE)
APR CLP MED LRG 5 ANG JAW (MISCELLANEOUS) ×1
BLADE EXTENDED COATED 6.5IN (ELECTRODE) ×1 IMPLANT
BLADE HEX COATED 2.75 (ELECTRODE) ×1 IMPLANT
BLADE SURG SZ10 CARB STEEL (BLADE) ×2 IMPLANT
BRR ADH 6X5 SEPRAFILM 1 SHT (MISCELLANEOUS) ×1
CABLE HIGH FREQUENCY MONO STRZ (ELECTRODE) ×2 IMPLANT
CANISTER SUCTION 2500CC (MISCELLANEOUS) ×1 IMPLANT
CELLS DAT CNTRL 66122 CELL SVR (MISCELLANEOUS) IMPLANT
CLAMP POUCH DRAINAGE QUIET (OSTOMY) ×1 IMPLANT
CLIP APPLIE 5 13 M/L LIGAMAX5 (MISCELLANEOUS) IMPLANT
CLIP APPLIE ROT 10 11.4 M/L (STAPLE) IMPLANT
COUNTER NEEDLE 20 DBL MAG RED (NEEDLE) ×2 IMPLANT
COVER MAYO STAND STRL (DRAPES) ×4 IMPLANT
DECANTER SPIKE VIAL GLASS SM (MISCELLANEOUS) ×1 IMPLANT
DISSECTOR BLUNT TIP ENDO 5MM (MISCELLANEOUS) IMPLANT
DRAIN CHANNEL 19F RND (DRAIN) ×1 IMPLANT
DRAPE LAPAROSCOPIC ABDOMINAL (DRAPES) ×2 IMPLANT
DRAPE LG THREE QUARTER DISP (DRAPES) ×2 IMPLANT
DRAPE UTILITY XL STRL (DRAPES) ×4 IMPLANT
DRAPE WARM FLUID 44X44 (DRAPE) ×2 IMPLANT
DRSG OPSITE POSTOP 4X10 (GAUZE/BANDAGES/DRESSINGS) IMPLANT
DRSG OPSITE POSTOP 4X6 (GAUZE/BANDAGES/DRESSINGS) IMPLANT
DRSG OPSITE POSTOP 4X8 (GAUZE/BANDAGES/DRESSINGS) ×1 IMPLANT
DRSG TEGADERM 2-3/8X2-3/4 SM (GAUZE/BANDAGES/DRESSINGS) ×1 IMPLANT
ELECT PENCIL ROCKER SW 15FT (MISCELLANEOUS) ×4 IMPLANT
ELECT REM PT RETURN 9FT ADLT (ELECTROSURGICAL) ×2
ELECTRODE REM PT RTRN 9FT ADLT (ELECTROSURGICAL) ×1 IMPLANT
EVACUATOR SILICONE 100CC (DRAIN) ×1 IMPLANT
FILTER SMOKE EVAC LAPAROSHD (FILTER) IMPLANT
GAUZE SPONGE 2X2 8PLY STRL LF (GAUZE/BANDAGES/DRESSINGS) IMPLANT
GAUZE SPONGE 4X4 12PLY STRL (GAUZE/BANDAGES/DRESSINGS) ×1 IMPLANT
GLOVE ECLIPSE 8.0 STRL XLNG CF (GLOVE) ×5 IMPLANT
GLOVE INDICATOR 8.0 STRL GRN (GLOVE) ×4 IMPLANT
GOWN STRL REUS W/TWL XL LVL3 (GOWN DISPOSABLE) ×8 IMPLANT
KIT BASIN OR (CUSTOM PROCEDURE TRAY) ×3 IMPLANT
LEGGING LITHOTOMY PAIR STRL (DRAPES) ×2 IMPLANT
LIGASURE IMPACT 36 18CM CVD LR (INSTRUMENTS) ×1 IMPLANT
MANIFOLD NEPTUNE II (INSTRUMENTS) ×2 IMPLANT
PENCIL BUTTON HOLSTER BLD 10FT (ELECTRODE) IMPLANT
POUCH DRAINABLE 1PC 2 1/4 FLAT (OSTOMY) ×1 IMPLANT
RETRACTOR WND ALEXIS 18 MED (MISCELLANEOUS) IMPLANT
RTRCTR WOUND ALEXIS 18CM MED (MISCELLANEOUS)
SCISSORS LAP 5X35 DISP (ENDOMECHANICALS) ×2 IMPLANT
SEPRAFILM MEMBRANE 5X6 (MISCELLANEOUS) ×1 IMPLANT
SET IRRIG TUBING LAPAROSCOPIC (IRRIGATION / IRRIGATOR) IMPLANT
SHEARS HARMONIC ACE PLUS 36CM (ENDOMECHANICALS) ×1 IMPLANT
SLEEVE XCEL OPT CAN 5 100 (ENDOMECHANICALS) ×5 IMPLANT
SOLUTION ANTI FOG 6CC (MISCELLANEOUS) ×2 IMPLANT
SPONGE DRAIN TRACH 4X4 STRL 2S (GAUZE/BANDAGES/DRESSINGS) ×1 IMPLANT
SPONGE GAUZE 2X2 STER 10/PKG (GAUZE/BANDAGES/DRESSINGS) ×1
SPONGE LAP 18X18 X RAY DECT (DISPOSABLE) ×4 IMPLANT
STAPLER CUT CVD 40MM BLUE (STAPLE) ×1 IMPLANT
STAPLER PROXIMATE 75MM BLUE (STAPLE) ×1 IMPLANT
STAPLER VISISTAT 35W (STAPLE) ×1 IMPLANT
STRIP CLOSURE SKIN 1/2X4 (GAUZE/BANDAGES/DRESSINGS) ×1 IMPLANT
SUCTION POOLE TIP (SUCTIONS) ×2 IMPLANT
SUT ETHILON 2 0 PS N (SUTURE) ×1 IMPLANT
SUT MNCRL AB 4-0 PS2 18 (SUTURE) ×1 IMPLANT
SUT PDS AB 1 CTX 36 (SUTURE) IMPLANT
SUT PDS AB 1 TP1 96 (SUTURE) ×2 IMPLANT
SUT PROLENE 2 0 KS (SUTURE) IMPLANT
SUT PROLENE 2 0 SH DA (SUTURE) ×1 IMPLANT
SUT SILK 2 0 (SUTURE) ×2
SUT SILK 2 0 SH CR/8 (SUTURE) ×2 IMPLANT
SUT SILK 2-0 18XBRD TIE 12 (SUTURE) ×1 IMPLANT
SUT SILK 3 0 (SUTURE) ×2
SUT SILK 3 0 SH CR/8 (SUTURE) ×3 IMPLANT
SUT SILK 3-0 18XBRD TIE 12 (SUTURE) ×1 IMPLANT
SUT VIC AB 2-0 SH 18 (SUTURE) ×1 IMPLANT
SUT VIC AB 3-0 SH 8-18 (SUTURE) ×1 IMPLANT
SUT VICRYL 2 0 18  UND BR (SUTURE) ×1
SUT VICRYL 2 0 18 UND BR (SUTURE) ×1 IMPLANT
SYR BULB IRRIGATION 50ML (SYRINGE) ×2 IMPLANT
SYS LAPSCP GELPORT 120MM (MISCELLANEOUS) ×2
SYSTEM LAPSCP GELPORT 120MM (MISCELLANEOUS) IMPLANT
TAPE PAPER 3X10 WHT MICROPORE (GAUZE/BANDAGES/DRESSINGS) ×1 IMPLANT
TOWEL OR 17X26 10 PK STRL BLUE (TOWEL DISPOSABLE) ×4 IMPLANT
TOWEL OR NON WOVEN STRL DISP B (DISPOSABLE) ×4 IMPLANT
TRAY FOLEY CATH 14FRSI W/METER (CATHETERS) ×2 IMPLANT
TRAY LAP CHOLE (CUSTOM PROCEDURE TRAY) ×2 IMPLANT
TROCAR BLADELESS OPT 5 100 (ENDOMECHANICALS) ×2 IMPLANT
TROCAR XCEL BLUNT TIP 100MML (ENDOMECHANICALS) IMPLANT
TROCAR XCEL NON-BLD 11X100MML (ENDOMECHANICALS) IMPLANT
TUBING INSUFFLATION 10FT LAP (TUBING) ×2 IMPLANT
YANKAUER SUCT BULB TIP 10FT TU (MISCELLANEOUS) ×5 IMPLANT

## 2014-02-21 NOTE — Anesthesia Preprocedure Evaluation (Addendum)
Anesthesia Evaluation  Patient identified by MRN, date of birth, ID band Patient awake    Reviewed: Allergy & Precautions, H&P , NPO status , Patient's Chart, lab work & pertinent test results  History of Anesthesia Complications (+) history of anesthetic complications  Airway Mallampati: II TM Distance: >3 FB Neck ROM: Full    Dental no notable dental hx.    Pulmonary neg pulmonary ROS,  breath sounds clear to auscultation  Pulmonary exam normal       Cardiovascular Exercise Tolerance: Good negative cardio ROS  Rhythm:Regular Rate:Normal     Neuro/Psych  Headaches, negative psych ROS   GI/Hepatic negative GI ROS, Neg liver ROS,   Endo/Other  negative endocrine ROS  Renal/GU negative Renal ROS  negative genitourinary   Musculoskeletal negative musculoskeletal ROS (+)   Abdominal   Peds negative pediatric ROS (+)  Hematology negative hematology ROS (+)   Anesthesia Other Findings   Reproductive/Obstetrics negative OB ROS                           Anesthesia Physical Anesthesia Plan  ASA: II  Anesthesia Plan: General   Post-op Pain Management:    Induction: Intravenous  Airway Management Planned: Oral ETT  Additional Equipment:   Intra-op Plan:   Post-operative Plan: Extubation in OR  Informed Consent: I have reviewed the patients History and Physical, chart, labs and discussed the procedure including the risks, benefits and alternatives for the proposed anesthesia with the patient or authorized representative who has indicated his/her understanding and acceptance.   Dental advisory given  Plan Discussed with: CRNA  Anesthesia Plan Comments:         Anesthesia Quick Evaluation

## 2014-02-21 NOTE — H&P (Signed)
Chloe Leonard is an 58 y.o. female.   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 She has intermittent green vaginal discharge but no obvious fistula seen on GYN exam although it is suspicious for fistula on CT.     Past Medical History  Diagnosis Date  . Diverticulitis   . Headache(784.0) age 30    x1  . Osteoarthritis   . Complication of anesthesia     "extreme pain after carpal tunnel release"    Past Surgical History  Procedure Laterality Date  . Carpal tunnel release Bilateral 2001  . Pelvic laparoscopy  04/04/2002  . Knee arthroscopy Left 07/2003  . Lipoma excision Right years ago    Family History  Problem Relation Age of Onset  . Cancer Father     melanoma   Social History:  reports that she has never smoked. She has never used smokeless tobacco. She reports that she does not drink alcohol or use illicit drugs.  Allergies:  Allergies  Allergen Reactions  . Augmentin [Amoxicillin-Pot Clavulanate] Nausea And Vomiting  . Cortisone Other (See Comments)    Lips swell and face flushes     Medications Prior to Admission  Medication Sig Dispense Refill  . doxycycline (VIBRAMYCIN) 100 MG capsule Take 1 capsule (100 mg total) by mouth 2 (two) times daily.  42 capsule  2  . fexofenadine (ALLEGRA) 180 MG tablet Take 180 mg by mouth daily.       . fluticasone (FLONASE) 50 MCG/ACT nasal spray Place 1 spray into both nostrils daily as needed for allergies or rhinitis.       . Calcium-Magnesium-Vitamin D 300-20-200 MG-MG-UNIT CHEW Chew 1 tablet by mouth daily.      . cholecalciferol (VITAMIN D) 1000 UNITS tablet Take 1,000 Units by mouth daily.      . metroNIDAZOLE (FLAGYL) 500 MG tablet Take 1 tablet (500 mg total) by mouth 3 (three) times daily.  60 tablet  2  . Multiple Vitamin (MULTIVITAMIN WITH MINERALS) TABS tablet Take 1 tablet by mouth daily.         No results found for this or any previous visit (from the past 48 hour(s)). No results found.  Review of Systems  Constitutional: Negative for fever and chills.  Gastrointestinal: Negative for nausea and abdominal pain.  Genitourinary:       Intermittent vaginal discharge.    Blood pressure 104/65, pulse 90, temperature 97.9 F (36.6 C), temperature source Oral, resp. rate 20, SpO2 100.00%. Physical Exam  Constitutional: No distress.  Overweight.  HENT:  Head: Normocephalic and atraumatic.  Cardiovascular: Normal rate and regular rhythm.   Respiratory: Effort normal and breath sounds normal.  GI: Soft. She exhibits no mass. There is no tenderness.  Purple marks on left and right abdominal wall for possible ostomy sites.  Musculoskeletal: She exhibits no edema.  Neurological: She is alert.  Skin: Skin is warm and dry.     Assessment/Plan Persistent sigmoid diverticulitis with abscess and suspicion of colovaginal fistula.  Plan:  Laparoscopic assisted partial colectomy and possible colostomy or protective loop ileostomy.  Lesly Joslyn J 02/21/2014, 10:48 AM

## 2014-02-21 NOTE — Op Note (Addendum)
Operative Note  Chloe Leonard female 58 y.o. 02/21/2014  PREOPERATIVE DX:  Sigmoid diverticulitis with colovaginal fistula and persistent pelvic abscess  POSTOPERATIVE DX:  Same  PROCEDURE:  Laparoscopic assisted sigmoid colectomy. Mobilization of splenic flexure. Descending colostomy. Rigid proctosigmoidoscopy.         Surgeon: Odis Hollingshead   Assistants: Autumn Messing M.D.  Anesthesia: General endotracheal anesthesia  Indications: this is a 58 year old female with complicated sigmoid diverticulitis. She is also felt to have a colovaginal fistula by CT scan and a persistent pelvic abscess the remaining despite aspiration appropriate antibiotics. She presents for partial colectomy.    Procedure Detail:  She was seen in the holding area. She was brought to the operating room placed supine on the operating table and general anesthetic was given. She was placed in the lithotomy position. A Foley catheter and oral gastric tube were inserted. The abdominal wall, perineum, and vaginal areas were sterilely prepped and draped.  A 5 mm incision was made in the left upper quadrant. She was placed in slight reverse Trendelenburg position. Using a 5 mm Optiview trocar and laparoscope access was gained to the peritoneal cavity. A pneumoperitoneum was created. Inspection of the area under the trocar demonstrated no evidence of bleeding or organ injury. A 5 mm trocar was placed through a small epigastric incision. A 5 mm trocar was placed in the right lower quadrant. A 5 mm trocar was placed in the lower midline. Subsequently, a 5 mm trocar was placed in the left lower quadrant.  The abdomen was inspected. Inflammatory changes in the sigmoid colon and pelvic area were noted. This involved mostly the distal sigmoid colon. Using sharp dissection I began mobilizing the proximal sigmoid colon and left colon by dividing lateral attachments. Upon approaching the splenic flexure, it was noted that the colon was  densely adherent to the splenic flexure. Using the harmonic scalpel and sharp dissection the splenic flexure was mobilized. It was able to be brought below the level of the umbilicus giving good mobility to the left colon.  At this point, a limited lower midline incision was made in the lower midline trocar was removed. All layers were divided and the peritoneal cavity was entered. A very dense inflammatory process involving the sigmoid colon was noted. It was adherent to the lower uterus and upper vagina as well as lateral sidewall. I lengthened the incision for better exposure. Using careful blunt and sharp dissection as well as electrocautery close to the colon and began mobilizing the distal sigmoid colon. Finger fracture technique was also used. At approximately the level of the vagina I broke into an abscess cavity and purulent fluid was evacuated. The abscess cavity measured approximately 3 cm. This was consistent with what was seen on the CT scan. I further began mobilizing the colon using dissection close to it. I then divided the colon at the sigmoid colon descending colon junction which demonstrated normal colon. The mesentery of this segment was divided very close to the bowel down into the pelvis. We did identify the left ureter and it was preserved. Subsequently we wre able to mobilize the inflammatory mass completely out of the pelvis. There were significant inflammatory changes in the mid rectal area as well as the vaginal area. No obvious defect in the vagina could be seen. The proximal rectum was fairly normal appearing. I divided the proximal rectum with the linear cutting stapler. The proximal portion of the specimen was marked with a suture and handed off the field  the sigmoid colon.  A bimanual vaginal exam was performed and no defect could be found. No defect could be seen during the exam from the intra-abdominal cavity. A rigid proctosigmoidoscopy was performed and the mucosa of the rectum  appeared to be within normal limits. There was no evidence of leak from the rectal stump.  I then copiously irrigated out the abdominal cavity. Because of the abscess which was drained and a significant amount of inflammatory change in the pelvis, I elected not to perform an anastomosis but rather to perform a descending colostomy. The pelvis was copiously irrigated. The left lower quadrant trocar was removed and a 19 Blake drain was placed into the abdomen and positioned so the tip was in the abscess cavity and the remainder was in the pelvis area. It was anchored to the skin with 3-0 nylon suture. The remaining trocars were removed.  A descending colostomy site had been previously marked in the left midabdominal wall. A circular incision was made through the skin and subcutaneous tissue. A cruciate incision was made in the anterior and posterior fascial layers. A descending colostomy segment was brought up through the defect. It was anchored to the anterior fascia with 3 interrupted 2-0 Vicryl sutures.  The fascia of the lower midline incision was then closed with running double looped #1 PDS. The subcutaneous tissue was irrigated and the skin of the lower midline incision was closed with staples. The skin of the trocar incisions were closed with 4-0 Monocryl subcuticular stitches followed by Steri-Strips and sterile dressings. The colostomy was ensured with interrupted 3-0 Vicryl sutures and a colostomy appliance was placed on it. A sterile dressing was placed on the lower midline wound.  She tolerated the procedure well without any apparent complications and was taken to the recovery room in satisfactory condition.  Estimated Blood Loss:  400 ml         Drains: #19 Blake  Blood Given: none          Specimens: sigmoid colon        Complications:  * No complications entered in OR log *         Disposition: PACU - hemodynamically stable.         Condition: stable

## 2014-02-21 NOTE — Transfer of Care (Signed)
Immediate Anesthesia Transfer of Care Note  Patient: Chloe Leonard  Procedure(s) Performed: Procedure(s): LAPAROSCOPIC ASSISTED SIGMOID COLECTOMY WITH MOBILIZATION OF SPLENIC FLEXION, DESCENDING COLOSTOMY, RIGID PROCTOSCOPE (N/A)  Patient Location: PACU  Anesthesia Type:General  Level of Consciousness: Patient easily awoken, sedated, comfortable, cooperative, following commands, responds to stimulation.   Airway & Oxygen Therapy: Patient spontaneously breathing, ventilating well, oxygen via simple oxygen mask.  Post-op Assessment: Report given to PACU RN, vital signs reviewed and stable, moving all extremities.   Post vital signs: Reviewed and stable.  Complications: No apparent anesthesia complications

## 2014-02-21 NOTE — Anesthesia Postprocedure Evaluation (Signed)
  Anesthesia Post-op Note  Patient: Chloe Leonard  Procedure(s) Performed: Procedure(s) (LRB): LAPAROSCOPIC ASSISTED SIGMOID COLECTOMY WITH MOBILIZATION OF SPLENIC FLEXION, DESCENDING COLOSTOMY, RIGID PROCTOSCOPE (N/A)  Patient Location: PACU  Anesthesia Type: General  Level of Consciousness: awake and alert   Airway and Oxygen Therapy: Patient Spontanous Breathing  Post-op Pain: mild  Post-op Assessment: Post-op Vital signs reviewed, Patient's Cardiovascular Status Stable, Respiratory Function Stable, Patent Airway and No signs of Nausea or vomiting  Last Vitals:  Filed Vitals:   02/21/14 1609  BP:   Pulse: 90  Temp:   Resp: 14    Post-op Vital Signs: stable   Complications: No apparent anesthesia complications

## 2014-02-21 NOTE — Progress Notes (Signed)
3mg  Bolus dose given with Jeannie Hickman.

## 2014-02-22 LAB — BASIC METABOLIC PANEL
Anion gap: 7 (ref 5–15)
BUN: 5 mg/dL — ABNORMAL LOW (ref 6–23)
CO2: 29 mEq/L (ref 19–32)
Calcium: 8.5 mg/dL (ref 8.4–10.5)
Chloride: 100 mEq/L (ref 96–112)
Creatinine, Ser: 0.62 mg/dL (ref 0.50–1.10)
GFR calc Af Amer: >90 mL/min (ref >90)
GFR calc non Af Amer: >90 mL/min (ref >90)
Glucose, Bld: 118 mg/dL — ABNORMAL HIGH (ref 70–99)
Potassium: 4.3 mEq/L (ref 3.7–5.3)
Sodium: 136 mEq/L — ABNORMAL LOW (ref 137–147)

## 2014-02-22 LAB — CBC
HCT: 35.5 % — ABNORMAL LOW (ref 36.0–46.0)
Hemoglobin: 11.8 g/dL — ABNORMAL LOW (ref 12.0–15.0)
MCH: 30 pg (ref 26.0–34.0)
MCHC: 33.2 g/dL (ref 30.0–36.0)
MCV: 90.3 fL (ref 78.0–100.0)
Platelets: 267 10*3/uL (ref 150–400)
RBC: 3.93 MIL/uL (ref 3.87–5.11)
RDW: 13.7 % (ref 11.5–15.5)
WBC: 11.5 10*3/uL — ABNORMAL HIGH (ref 4.0–10.5)

## 2014-02-22 NOTE — Progress Notes (Signed)
1 Day Post-Op  Subjective: Sore but pain controlled fairly well with PCA.    Objective: Vital signs in last 24 hours: Temp:  [97 F (36.1 C)-98.8 F (37.1 C)] 97.8 F (36.6 C) (08/08 0618) Pulse Rate:  [79-95] 92 (08/08 0618) Resp:  [11-27] 14 (08/08 0726) BP: (104-150)/(58-79) 115/76 mmHg (08/08 0618) SpO2:  [99 %-100 %] 100 % (08/08 0726) Weight:  [171 lb (77.565 kg)] 171 lb (77.565 kg) (08/07 1703)    Intake/Output from previous day: 08/07 0701 - 08/08 0700 In: 4310 [P.O.:60; I.V.:3800; IV Piggyback:450] Out: 8563 [Urine:1250; Drains:205; Blood:300] Intake/Output this shift:    PE: General- In NAD Abdomen-soft, some bruising to the left of lower midline incision, stoma edematous and viable, few bowel sounds  Lab Results:   Recent Labs  02/22/14 0544  WBC 11.5*  HGB 11.8*  HCT 35.5*  PLT 267   BMET  Recent Labs  02/22/14 0544  NA 136*  K 4.3  CL 100  CO2 29  GLUCOSE 118*  BUN 5*  CREATININE 0.62  CALCIUM 8.5   PT/INR No results found for this basename: LABPROT, INR,  in the last 72 hours Comprehensive Metabolic Panel:    Component Value Date/Time   NA 136* 02/22/2014 0544   NA 139 02/18/2014 0910   K 4.3 02/22/2014 0544   K 4.3 02/18/2014 0910   CL 100 02/22/2014 0544   CL 101 02/18/2014 0910   CO2 29 02/22/2014 0544   CO2 27 02/18/2014 0910   BUN 5* 02/22/2014 0544   BUN 11 02/18/2014 0910   CREATININE 0.62 02/22/2014 0544   CREATININE 0.60 02/18/2014 0910   GLUCOSE 118* 02/22/2014 0544   GLUCOSE 86 02/18/2014 0910   CALCIUM 8.5 02/22/2014 0544   CALCIUM 9.1 02/18/2014 0910   AST 19 02/18/2014 0910   AST 27 12/16/2013 1230   ALT 15 02/18/2014 0910   ALT 17 12/16/2013 1230   ALKPHOS 75 02/18/2014 0910   ALKPHOS 81 12/16/2013 1230   BILITOT 0.2* 02/18/2014 0910   BILITOT 0.4 12/16/2013 1230   PROT 7.2 02/18/2014 0910   PROT 8.0 12/16/2013 1230   ALBUMIN 3.4* 02/18/2014 0910   ALBUMIN 4.0 12/16/2013 1230     Studies/Results: No results found.  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  Stable overnight.      LOS: 1 day   Plan: Clear liquids, OOB.   Jamai Dolce J 02/22/2014

## 2014-02-23 MED ORDER — ACETAMINOPHEN 325 MG PO TABS
650.0000 mg | ORAL_TABLET | Freq: Four times a day (QID) | ORAL | Status: DC | PRN
  Administered 2014-02-23 – 2014-02-26 (×2): 650 mg via ORAL
  Filled 2014-02-23 (×3): qty 2

## 2014-02-23 NOTE — Progress Notes (Signed)
2 Days Post-Op  Subjective: Has a headache.  Walked yesterday.  No nausea.  Objective: Vital signs in last 24 hours: Temp:  [98.2 F (36.8 C)-99.6 F (37.6 C)] 98.9 F (37.2 C) (08/09 0600) Pulse Rate:  [97-114] 98 (08/09 0600) Resp:  [17-23] 20 (08/09 0600) BP: (108-120)/(59-71) 114/66 mmHg (08/09 0600) SpO2:  [95 %-100 %] 98 % (08/09 0600)    Intake/Output from previous day: 08/08 0701 - 08/09 0700 In: 3633.3 [P.O.:600; I.V.:3033.3] Out: 3381 [Urine:3250; Drains:131] Intake/Output this shift:    PE: General- In NAD Abdomen-soft, some dull rednessto the left of lower midline incision, stoma edematous and viable with thin liquid output  Lab Results:   Recent Labs  02/22/14 0544  WBC 11.5*  HGB 11.8*  HCT 35.5*  PLT 267   BMET  Recent Labs  02/22/14 0544  NA 136*  K 4.3  CL 100  CO2 29  GLUCOSE 118*  BUN 5*  CREATININE 0.62  CALCIUM 8.5   PT/INR No results found for this basename: LABPROT, INR,  in the last 72 hours Comprehensive Metabolic Panel:    Component Value Date/Time   NA 136* 02/22/2014 0544   NA 139 02/18/2014 0910   K 4.3 02/22/2014 0544   K 4.3 02/18/2014 0910   CL 100 02/22/2014 0544   CL 101 02/18/2014 0910   CO2 29 02/22/2014 0544   CO2 27 02/18/2014 0910   BUN 5* 02/22/2014 0544   BUN 11 02/18/2014 0910   CREATININE 0.62 02/22/2014 0544   CREATININE 0.60 02/18/2014 0910   GLUCOSE 118* 02/22/2014 0544   GLUCOSE 86 02/18/2014 0910   CALCIUM 8.5 02/22/2014 0544   CALCIUM 9.1 02/18/2014 0910   AST 19 02/18/2014 0910   AST 27 12/16/2013 1230   ALT 15 02/18/2014 0910   ALT 17 12/16/2013 1230   ALKPHOS 75 02/18/2014 0910   ALKPHOS 81 12/16/2013 1230   BILITOT 0.2* 02/18/2014 0910   BILITOT 0.4 12/16/2013 1230   PROT 7.2 02/18/2014 0910   PROT 8.0 12/16/2013 1230   ALBUMIN 3.4* 02/18/2014 0910   ALBUMIN 4.0 12/16/2013 1230     Studies/Results: No results found.  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      LOS: 2 days   Plan: Advance to full liquids.  WOC consult tomorrow.   Gregoire Bennis J 02/23/2014

## 2014-02-24 NOTE — Consult Note (Signed)
WOC ostomy consult note Stoma type/location: LLQ Colostomy (descending) Stomal assessment/size: 1 and 3/8 inches round (with mild traction applied to 12 o'clock) Peristomal assessment: Intact, clear Treatment options for stomal/peristomal skin: None indicated Output: scant serous Ostomy pouching: 2pc., 2 and 1/4 inch pouching system. Pouch = Kellie Simmering #[id number], Skin barrier = Kellie Simmering [phone] Education provided: Husband present for teaching session, patient is alert, but with nausea, so washcloth is over face.  Nodding in affirmation at appropriate intervals. Pouch removal,Stoma sizing, pouching system preparation and pouching system application demonstrated.  Lock and Roll closure demonstrated.  Questions asked about change interval (twice weekly) and emptying schedule (when 1/3 to 1/2 full).  Education al booklet provided for husband to peruse tonight.  I will plan to see in am.  Supplies at bedside. [place] nursing team will follow, and will remain available to this patient, the nursing, surgical and medical teams.   Thanks, Maudie Flakes, MSN, RN, [place], [place], [place] [phone])

## 2014-02-24 NOTE — Progress Notes (Signed)
3 Days Post-Op  Subjective: Having some nausea this AM.  Objective: Vital signs in last 24 hours: Temp:  [98.2 F (36.8 C)-98.5 F (36.9 C)] 98.5 F (36.9 C) (08/10 0626) Pulse Rate:  [84-93] 84 (08/10 0626) Resp:  [15-18] 16 (08/10 0750) BP: (92-128)/(54-66) 115/66 mmHg (08/10 0626) SpO2:  [97 %-100 %] 100 % (08/10 0750)    Intake/Output from previous day: 08/09 0701 - 08/10 0700 In: 3480 [P.O.:480; I.V.:3000] Out: 2590 [Urine:2200; Drains:390] Intake/Output this shift: Total I/O In: 500 [P.O.:500] Out: -   PE: General- In NAD Abdomen-soft, some evolving ecchymosis around lower midline incision, stoma edematous and viable with minimal output, serous drain output  Lab Results:   Recent Labs  02/22/14 0544  WBC 11.5*  HGB 11.8*  HCT 35.5*  PLT 267   BMET  Recent Labs  02/22/14 0544  NA 136*  K 4.3  CL 100  CO2 29  GLUCOSE 118*  BUN 5*  CREATININE 0.62  CALCIUM 8.5   PT/INR No results found for this basename: LABPROT, INR,  in the last 72 hours Comprehensive Metabolic Panel:    Component Value Date/Time   NA 136* 02/22/2014 0544   NA 139 02/18/2014 0910   K 4.3 02/22/2014 0544   K 4.3 02/18/2014 0910   CL 100 02/22/2014 0544   CL 101 02/18/2014 0910   CO2 29 02/22/2014 0544   CO2 27 02/18/2014 0910   BUN 5* 02/22/2014 0544   BUN 11 02/18/2014 0910   CREATININE 0.62 02/22/2014 0544   CREATININE 0.60 02/18/2014 0910   GLUCOSE 118* 02/22/2014 0544   GLUCOSE 86 02/18/2014 0910   CALCIUM 8.5 02/22/2014 0544   CALCIUM 9.1 02/18/2014 0910   AST 19 02/18/2014 0910   AST 27 12/16/2013 1230   ALT 15 02/18/2014 0910   ALT 17 12/16/2013 1230   ALKPHOS 75 02/18/2014 0910   ALKPHOS 81 12/16/2013 1230   BILITOT 0.2* 02/18/2014 0910   BILITOT 0.4 12/16/2013 1230   PROT 7.2 02/18/2014 0910   PROT 8.0 12/16/2013 1230   ALBUMIN 3.4* 02/18/2014 0910   ALBUMIN 4.0 12/16/2013 1230     Studies/Results: No results found.  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  WOC consult today.   Opaline Reyburn J 02/24/2014

## 2014-02-25 ENCOUNTER — Encounter (HOSPITAL_COMMUNITY): Payer: Self-pay | Admitting: General Surgery

## 2014-02-25 NOTE — Consult Note (Addendum)
WOC ostomy follow up Stoma type/location: LLQ Colostomy with ileus.  Stomal assessment/size: 1 and 3/8 inches round with mild traction applied at 12 o'clock Peristomal assessment: Not seen today, pouch applied yesterday is intact and patient is with ileus Treatment options for stomal/peristomal skin: not seen today Output None, serous. Small amount of flatus in pouch Ostomy pouching: 2pc, 2 and 1/4 inch pouching system intact Education provided: Patient and husband taught that movement will enhance return for bowel activity.  Patient had a small regular breakfast today and is having lunch at the time of my visit today.  Looks more hopeful today than yesterday. Pouch change schedule is Monday/Thursday. Enrolled patient in [facility] Start Discharge program: Yes [place] nursing team will follow, and will remain available to this patient, the nursing, surgical and medical teams.   Thanks, Maudie Flakes, MSN, RN, [place], [place], [place] [phone])

## 2014-02-25 NOTE — Progress Notes (Signed)
4 Days Post-Op  Subjective: Feels better today.  Nausea is gone.  Objective: Vital signs in last 24 hours: Temp:  [98.1 F (36.7 C)-98.7 F (37.1 C)] 98.3 F (36.8 C) (08/11 0544) Pulse Rate:  [90-98] 90 (08/11 0544) Resp:  [11-18] 18 (08/11 0544) BP: (121-132)/(76-81) 132/78 mmHg (08/11 0544) SpO2:  [98 %-100 %] 98 % (08/11 0544)    Intake/Output from previous day: 08/10 0701 - 08/11 0700 In: 2606.7 [P.O.:240; I.V.:1316.7; IV Piggyback:1050] Out: 5080 [Urine:4700; Drains:380] Intake/Output this shift:    PE: General- In NAD Abdomen-soft, some ecchymosis around lower midline incision, stoma edematous and viable with minimal output, serous drain output, active bowel sounds  Lab Results:   Pathology:  Diverticular disease, no malignancy No results found for this basename: WBC, HGB, HCT, PLT,  in the last 72 hours BMET No results found for this basename: NA, K, CL, CO2, GLUCOSE, BUN, CREATININE, CALCIUM,  in the last 72 hours PT/INR No results found for this basename: LABPROT, INR,  in the last 72 hours Comprehensive Metabolic Panel:    Component Value Date/Time   NA 136* 02/22/2014 0544   NA 139 02/18/2014 0910   K 4.3 02/22/2014 0544   K 4.3 02/18/2014 0910   CL 100 02/22/2014 0544   CL 101 02/18/2014 0910   CO2 29 02/22/2014 0544   CO2 27 02/18/2014 0910   BUN 5* 02/22/2014 0544   BUN 11 02/18/2014 0910   CREATININE 0.62 02/22/2014 0544   CREATININE 0.60 02/18/2014 0910   GLUCOSE 118* 02/22/2014 0544   GLUCOSE 86 02/18/2014 0910   CALCIUM 8.5 02/22/2014 0544   CALCIUM 9.1 02/18/2014 0910   AST 19 02/18/2014 0910   AST 27 12/16/2013 1230   ALT 15 02/18/2014 0910   ALT 17 12/16/2013 1230   ALKPHOS 75 02/18/2014 0910   ALKPHOS 81 12/16/2013 1230   BILITOT 0.2* 02/18/2014 0910   BILITOT 0.4 12/16/2013 1230   PROT 7.2 02/18/2014 0910   PROT 8.0 12/16/2013 1230   ALBUMIN 3.4* 02/18/2014 0910   ALBUMIN 4.0 12/16/2013 1230     Studies/Results: No results found.  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   Chloe Leonard J 02/25/2014

## 2014-02-26 MED ORDER — OXYCODONE HCL 5 MG PO TABS
5.0000 mg | ORAL_TABLET | ORAL | Status: DC | PRN
  Administered 2014-02-26 – 2014-02-27 (×3): 10 mg via ORAL
  Administered 2014-02-27: 5 mg via ORAL
  Administered 2014-02-27: 10 mg via ORAL
  Filled 2014-02-26 (×2): qty 2
  Filled 2014-02-26: qty 1
  Filled 2014-02-26 (×2): qty 2

## 2014-02-26 NOTE — Care Management Note (Signed)
    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 From home with husband     Action/Plan:   Return home with husband and [place] services   Anticipated DC Date:  02/28/2014   Anticipated DC Plan:  [place]  CM consult      [facility] Choice  HOME HEALTH   Choice offered to / List presented to:  C-1 Patient        [place] arranged  HH-1 RN      [place].   Status of service:  Completed, signed off Medicare Important Message given?   (If response is "NO", the following Medicare IM given date fields will be blank) Date Medicare IM given:   Medicare IM given by:   Date Additional Medicare IM given:   Additional Medicare IM given by:    Discharge Disposition:  [place]  Per UR Regulation:  Reviewed for med. necessity/level of care/duration of stay  If discussed at [place] of Stay Meetings, dates discussed:    Comments:

## 2014-02-26 NOTE — Progress Notes (Signed)
5 Days Post-Op  Subjective: Tolerating regular diet.  Passing some flatus per colostomy but no stool yet.  Objective: Vital signs in last 24 hours: Temp:  [98 F (36.7 C)-98.3 F (36.8 C)] 98 F (36.7 C) (08/12 0532) Pulse Rate:  [86-91] 86 (08/12 0532) Resp:  [14-19] 14 (08/12 0807) BP: (110-127)/(64-82) 124/70 mmHg (08/12 0532) SpO2:  [95 %-100 %] 99 % (08/12 0807)    Intake/Output from previous day: 08/11 0701 - 08/12 0700 In: 2986.7 [P.O.:970; I.V.:1566.7; IV Piggyback:450] Out: 3605 [Urine:3450; Drains:155] Intake/Output this shift:    PE: General- In NAD Abdomen-soft,  ecchymosis around lower midline incision is resolving, stoma edematous and viable with some gas in bag, serous drain output  Lab Results:   Pathology:  Diverticular disease, no malignancy No results found for this basename: WBC, HGB, HCT, PLT,  in the last 72 hours BMET No results found for this basename: NA, K, CL, CO2, GLUCOSE, BUN, CREATININE, CALCIUM,  in the last 72 hours PT/INR No results found for this basename: LABPROT, INR,  in the last 72 hours Comprehensive Metabolic Panel:    Component Value Date/Time   NA 136* 02/22/2014 0544   NA 139 02/18/2014 0910   K 4.3 02/22/2014 0544   K 4.3 02/18/2014 0910   CL 100 02/22/2014 0544   CL 101 02/18/2014 0910   CO2 29 02/22/2014 0544   CO2 27 02/18/2014 0910   BUN 5* 02/22/2014 0544   BUN 11 02/18/2014 0910   CREATININE 0.62 02/22/2014 0544   CREATININE 0.60 02/18/2014 0910   GLUCOSE 118* 02/22/2014 0544   GLUCOSE 86 02/18/2014 0910   CALCIUM 8.5 02/22/2014 0544   CALCIUM 9.1 02/18/2014 0910   AST 19 02/18/2014 0910   AST 27 12/16/2013 1230   ALT 15 02/18/2014 0910   ALT 17 12/16/2013 1230   ALKPHOS 75 02/18/2014 0910   ALKPHOS 81 12/16/2013 1230   BILITOT 0.2* 02/18/2014 0910   BILITOT 0.4 12/16/2013 1230   PROT 7.2 02/18/2014 0910   PROT 8.0 12/16/2013 1230   ALBUMIN 3.4* 02/18/2014 0910   ALBUMIN 4.0 12/16/2013 1230     Studies/Results: No results  found.  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  Oral analgesic.   Lakshya Mcgillicuddy J 02/26/2014

## 2014-02-27 MED ORDER — ONDANSETRON 8 MG/NS 50 ML IVPB
8.0000 mg | Freq: Four times a day (QID) | INTRAVENOUS | Status: DC | PRN
Start: 2014-02-27 — End: 2014-03-03

## 2014-02-27 MED ORDER — METOCLOPRAMIDE HCL 5 MG/ML IJ SOLN: 5.0000 mg | Freq: Four times a day (QID) | INTRAMUSCULAR | Status: DC | PRN

## 2014-02-27 MED ORDER — ONDANSETRON HCL 4 MG/2ML IJ SOLN: 4.0000 mg | Freq: Four times a day (QID) | INTRAMUSCULAR | Status: DC | PRN

## 2014-02-27 MED ORDER — MORPHINE SULFATE 2 MG/ML IJ SOLN
2.0000 mg | INTRAMUSCULAR | Status: DC | PRN
  Administered 2014-02-27 (×2): 2 mg via INTRAVENOUS
  Filled 2014-02-27 (×2): qty 1

## 2014-02-27 MED ORDER — HYDROMORPHONE HCL PF 1 MG/ML IJ SOLN
0.5000 mg | INTRAMUSCULAR | Status: DC | PRN
  Administered 2014-02-28 – 2014-03-01 (×3): 1 mg via INTRAVENOUS
  Filled 2014-02-27 (×3): qty 1

## 2014-02-27 MED ORDER — PROMETHAZINE HCL 25 MG/ML IJ SOLN
6.2500 mg | Freq: Four times a day (QID) | INTRAMUSCULAR | Status: DC | PRN
  Administered 2014-02-27: 25 mg via INTRAVENOUS
  Filled 2014-02-27: qty 1

## 2014-02-27 MED ORDER — KCL-LACTATED RINGERS-D5W 20 MEQ/L IV SOLN
INTRAVENOUS | Status: DC
  Administered 2014-02-27: 10:00:00 via INTRAVENOUS
  Administered 2014-02-28: 50 mL via INTRAVENOUS
  Administered 2014-03-01 – 2014-03-02 (×2): 50 mL/h via INTRAVENOUS
  Filled 2014-02-27 (×7): qty 1000

## 2014-02-27 MED ORDER — HYDROCODONE-ACETAMINOPHEN 5-325 MG PO TABS
1.0000 | ORAL_TABLET | ORAL | Status: DC | PRN
  Administered 2014-02-27 – 2014-03-01 (×6): 2 via ORAL
  Administered 2014-03-01 – 2014-03-03 (×2): 1 via ORAL
  Filled 2014-02-27: qty 1
  Filled 2014-02-27 (×5): qty 2
  Filled 2014-02-27: qty 1
  Filled 2014-02-27: qty 2
  Filled 2014-02-27: qty 1

## 2014-02-27 NOTE — Consult Note (Signed)
WOC ostomy consult note Stoma type/location: LLQ Colostomy Stomal assessment/size: 1 3/8" pink and moist, flush with abdomen.  Barrier ring added.  Peristomal assessment: Intact Treatment options for stomal/peristomal skin: barrier ring to add convexity. Output Small amount soft brown stool.  Ostomy pouching: /2pc. 2 1/4" 2 piece system. Barrier ring.  Supplies in room   Education provided:  Husband at bedside.  Completed pouch change.  Patient is minimally involved, but spouse actively participated in pouch change.  Asked appropriate questions regarding, pouch change, frequency, showering and obtaining supplies after discharge.  If MD agrees, [facility] may be a valuable resource since patient still minimally involved in self care.  [place] team will continue to follow.   Domenic Moras RN BSN [place] Pager [phone]

## 2014-02-27 NOTE — Progress Notes (Signed)
6 Days Post-Op  Subjective: Having some waves of lower abdominal pain and nausea that began last night.  Felt good this AM but sxs recurred after eating breakfast.   Objective: Vital signs in last 24 hours: Temp:  [98.1 F (36.7 C)-98.9 F (37.2 C)] 98.5 F (36.9 C) (08/13 0600) Pulse Rate:  [86-97] 86 (08/13 0600) Resp:  [16-18] 16 (08/13 0600) BP: (107-128)/(67-76) 128/76 mmHg (08/13 0600) SpO2:  [90 %-100 %] 97 % (08/13 0600)    Intake/Output from previous day: 08/12 0701 - 08/13 0700 In: 600 [P.O.:600] Out: 2625 [Urine:2500; Drains:125] Intake/Output this shift: Total I/O In: -  Out: 500 [Urine:500]  PE: General- In NAD Abdomen-soft, incisions are clean and intact, stoma edematous and viable with some gas and green liquid in the bag, active bowel sounds are present Lab Results:   Pathology:  Diverticular disease, no malignancy No results found for this basename: WBC, HGB, HCT, PLT,  in the last 72 hours BMET No results found for this basename: NA, K, CL, CO2, GLUCOSE, BUN, CREATININE, CALCIUM,  in the last 72 hours PT/INR No results found for this basename: LABPROT, INR,  in the last 72 hours Comprehensive Metabolic Panel:    Component Value Date/Time   NA 136* 02/22/2014 0544   NA 139 02/18/2014 0910   K 4.3 02/22/2014 0544   K 4.3 02/18/2014 0910   CL 100 02/22/2014 0544   CL 101 02/18/2014 0910   CO2 29 02/22/2014 0544   CO2 27 02/18/2014 0910   BUN 5* 02/22/2014 0544   BUN 11 02/18/2014 0910   CREATININE 0.62 02/22/2014 0544   CREATININE 0.60 02/18/2014 0910   GLUCOSE 118* 02/22/2014 0544   GLUCOSE 86 02/18/2014 0910   CALCIUM 8.5 02/22/2014 0544   CALCIUM 9.1 02/18/2014 0910   AST 19 02/18/2014 0910   AST 27 12/16/2013 1230   ALT 15 02/18/2014 0910   ALT 17 12/16/2013 1230   ALKPHOS 75 02/18/2014 0910   ALKPHOS 81 12/16/2013 1230   BILITOT 0.2* 02/18/2014 0910   BILITOT 0.4 12/16/2013 1230   PROT 7.2 02/18/2014 0910   PROT 8.0 12/16/2013 1230   ALBUMIN 3.4* 02/18/2014 0910   ALBUMIN 4.0  12/16/2013 1230     Studies/Results: No results found.  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      LOS: 6 days   Plan:  Restart IVFs.  Keep diet as is.  Not ready for discharge yet.   Chloe Leonard 02/27/2014

## 2014-02-27 NOTE — Progress Notes (Signed)
Pt complaining of persisitant nausea after administration of prn  Antiemetic. On call Dr Johney Maine paged. New orders entered.

## 2014-02-28 MED ORDER — HYDROCODONE-ACETAMINOPHEN 5-325 MG PO TABS: 1.0000 | ORAL_TABLET | ORAL | Status: DC | PRN

## 2014-02-28 NOTE — Discharge Instructions (Addendum)
[facility], [place] [phone]  OPEN ABDOMINAL SURGERY: POST OP INSTRUCTIONS  Always review your discharge instruction sheet given to you by the facility where your surgery was performed.  IF YOU HAVE DISABILITY OR FAMILY LEAVE FORMS, YOU MUST BRING THEM TO THE OFFICE FOR PROCESSING.  PLEASE DO NOT GIVE THEM TO YOUR DOCTOR.  1. A prescription for pain medication may be given to you upon discharge.  Take your pain medication as prescribed, if needed.  If narcotic pain medicine is not needed, then you may take acetaminophen (Tylenol) or ibuprofen (Advil) as needed. 2. Take your usually prescribed medications unless otherwise directed. 3. If you need a refill on your pain medication, please contact your pharmacy. They will contact our office to request authorization.  Prescriptions will not be filled after 5pm or on week-ends. 4. You should follow a light diet the first few days after arrival home, such as soup and crackers, pudding, etc.unless your doctor has advised otherwise. A high-fiber, low fat diet can be resumed as tolerated.   Be sure to include lots of fluids daily. 5. Most patients will experience some swelling and bruising in the area of the incision. Ice pack will help. Swelling and bruising can take several days to resolve..  6. It is common to experience some constipation if taking pain medication after surgery.  Increasing fluid intake and taking a stool softener will usually help or prevent this problem from occurring.  A mild laxative (Milk of Magnesia or Miralax) should be taken according to package directions if there are no bowel movements after 48 hours. 7.  You may have steri-strips (small skin tapes) in place directly over the incision.  These strips should be left on the skin for 7-10 days.  If your surgeon used skin glue on the incision, you may shower in 24 hours.  The glue will flake off over the next 2-3 weeks.  Any sutures or staples will be removed at  the office during your follow-up visit. You may find that a light gauze bandage over your incision may keep your staples from being rubbed or pulled. You may shower and replace the bandage daily. 8. ACTIVITIES:  You may resume regular (light) daily activities beginning the next day--such as daily self-care, walking, climbing stairs--gradually increasing activities as tolerated.  You may have sexual intercourse when it is comfortable.  Refrain from any heavy lifting or straining until approved by your doctor. a. You may drive when you no longer are taking prescription pain medication, you can comfortably wear a seatbelt, and you can safely maneuver your car and apply brakes b. Return to Work: _When released by MD__________________________________ 16. You should see your doctor in the office for a follow-up appointment approximately two weeks after your surgery.  Make sure that you call for this appointment within a day or two after you arrive home to insure a convenient appointment time. OTHER INSTRUCTIONS:  __Change dry dressing on wound 2-3 times a day.  Goal is to keep wound dry.___________________________________________________________ _____________________________________________________________  WHEN TO CALL YOUR DOCTOR: 1. Fever over 101.0 2. Inability to urinate 3. Nausea and/or vomiting 4. Extreme swelling or bruising 5. Continued bleeding from incision. 6. Increased pain, redness, or drainage from the incision.  The clinic staff is available to answer your questions during regular business hours.  Please dont hesitate to call and ask to speak to one of the nurses if you have concerns.  For further questions, please visit  [email or web address] ° °

## 2014-02-28 NOTE — Progress Notes (Signed)
7 Days Post-Op  Subjective: Feeling much better once Oxycodone switched to Hydrocodone.  No nausea.  Pain is better.   Objective: Vital signs in last 24 hours: Temp:  [98.1 F (36.7 C)-98.8 F (37.1 C)] 98.1 F (36.7 C) (08/14 0612) Pulse Rate:  [79-88] 79 (08/14 0612) Resp:  [14-16] 16 (08/14 0612) BP: (115-132)/(71-88) 126/75 mmHg (08/14 0612) SpO2:  [96 %-98 %] 98 % (08/14 0612)    Intake/Output from previous day: 08/13 0701 - 08/14 0700 In: 3391.3 [P.O.:480; I.V.:1511.3; IV Piggyback:1400] Out: 3420 [Urine:3350; Drains:70] Intake/Output this shift: Total I/O In: 120 [P.O.:120] Out: 500 [Urine:500]  PE: General- In NAD Abdomen-soft, incisions are clean and intact, stoma edematous and viable with some gas in the bag, active bowel sounds are present, drain output serous and drain was removed. Lab Results:   Pathology:  Diverticular disease, no malignancy No results found for this basename: WBC, HGB, HCT, PLT,  in the last 72 hours BMET No results found for this basename: NA, K, CL, CO2, GLUCOSE, BUN, CREATININE, CALCIUM,  in the last 72 hours PT/INR No results found for this basename: LABPROT, INR,  in the last 72 hours Comprehensive Metabolic Panel:    Component Value Date/Time   NA 136* 02/22/2014 0544   NA 139 02/18/2014 0910   K 4.3 02/22/2014 0544   K 4.3 02/18/2014 0910   CL 100 02/22/2014 0544   CL 101 02/18/2014 0910   CO2 29 02/22/2014 0544   CO2 27 02/18/2014 0910   BUN 5* 02/22/2014 0544   BUN 11 02/18/2014 0910   CREATININE 0.62 02/22/2014 0544   CREATININE 0.60 02/18/2014 0910   GLUCOSE 118* 02/22/2014 0544   GLUCOSE 86 02/18/2014 0910   CALCIUM 8.5 02/22/2014 0544   CALCIUM 9.1 02/18/2014 0910   AST 19 02/18/2014 0910   AST 27 12/16/2013 1230   ALT 15 02/18/2014 0910   ALT 17 12/16/2013 1230   ALKPHOS 75 02/18/2014 0910   ALKPHOS 81 12/16/2013 1230   BILITOT 0.2* 02/18/2014 0910   BILITOT 0.4 12/16/2013 1230   PROT 7.2 02/18/2014 0910   PROT 8.0 12/16/2013 1230   ALBUMIN 3.4* 02/18/2014  0910   ALBUMIN 4.0 12/16/2013 1230     Studies/Results: No results found.  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     LOS: 7 days   Plan:  Stop abxs, decrease IVF, hopefully home over weekend.   Tiran Sauseda J 02/28/2014

## 2014-03-01 NOTE — Progress Notes (Signed)
Patient ID: Chloe Leonard, female   DOB: Dec 03, 1955, 58 y.o.   MRN: [id number] 8 Days Post-Op  Subjective: Some crampy mid abdominal pain and borborygmi this morning. Some gas but no bowel movements per colostomy. No nausea. Tolerating diet okay. Has been ambulatory.  Objective: Vital signs in last 24 hours: Temp:  [98 F (36.7 C)-98.7 F (37.1 C)] 98.7 F (37.1 C) (08/15 0530) Pulse Rate:  [77-88] 77 (08/15 0530) Resp:  [16-18] 16 (08/15 0530) BP: (113-127)/(67-78) 120/76 mmHg (08/15 0530) SpO2:  [97 %-99 %] 97 % (08/15 0530)    Intake/Output from previous day: 08/14 0701 - 08/15 0700 In: 1637.5 [P.O.:360; I.V.:1277.5] Out: 2650 [Urine:2650] Intake/Output this shift:    General appearance: alert, cooperative and no distress GI: normal findings: soft, non-tender and minimally distended. The colostomy. Incision/Wound: slight erythema lower incision with without drainage  Lab Results:  No results found for this basename: WBC, HGB, HCT, PLT,  in the last 72 hours BMET No results found for this basename: NA, K, CL, CO2, GLUCOSE, BUN, CREATININE, CALCIUM,  in the last 72 hours   Studies/Results: No results found.  Anti-infectives: Anti-infectives   Start     Dose/Rate Route Frequency Ordered Stop   02/21/14 1800  doxycycline (VIBRAMYCIN) 100 mg in dextrose 5 % 250 mL IVPB  Status:  Discontinued     100 mg 125 mL/hr over 120 Minutes Intravenous Every 12 hours 02/21/14 1641 02/28/14 0934   02/21/14 1800  metroNIDAZOLE (FLAGYL) IVPB 500 mg  Status:  Discontinued     500 mg 100 mL/hr over 60 Minutes Intravenous Every 8 hours 02/21/14 1641 02/28/14 0934   02/21/14 1130  cefoTEtan in Dextrose 5% (CEFOTAN) IVPB 2 g     2 g Intravenous  Once 02/21/14 0816 02/21/14 1111      Assessment/Plan: s/p Procedure(s): LAPAROSCOPIC ASSISTED SIGMOID COLECTOMY WITH MOBILIZATION OF SPLENIC FLEXION, DESCENDING COLOSTOMY, RIGID PROCTOSCOPE Generally doing well. Probable resolving ileus with  some crampy abdominal pain. Patient is uncomfortable going home with this discomfort. Observe in hospital today.   LOS: 8 days    Chloe Leonard T 03/01/2014

## 2014-03-02 NOTE — Progress Notes (Signed)
Patient ID: Chloe Leonard, female   DOB: May 14, 1956, 58 y.o.   MRN: [id number] 9 Days Post-Op  Subjective: Feels better today. No abnormal pain or other complaints. Colostomy functioning.  Objective: Vital signs in last 24 hours: Temp:  [98.1 F (36.7 C)-98.9 F (37.2 C)] 98.1 F (36.7 C) (08/16 0618) Pulse Rate:  [74-93] 74 (08/16 0618) Resp:  [16-18] 18 (08/16 0618) BP: (110-144)/(71-78) 118/76 mmHg (08/16 0618) SpO2:  [98 %-100 %] 98 % (08/16 0618) Last BM Date: 03/02/14  Intake/Output from previous day: 08/15 0701 - 08/16 0700 In: 2040 [P.O.:840; I.V.:1200] Out: 2778 [Urine:3750; Stool:115] Intake/Output this shift:    General appearance: alert, cooperative and no distress GI: normal findings: soft, non-tender and stooling colostomy bag Incision/Wound: small area of erythema at lower end of the incision without drainage  or tenderness or induration  Lab Results:  No results found for this basename: WBC, HGB, HCT, PLT,  in the last 72 hours BMET No results found for this basename: NA, K, CL, CO2, GLUCOSE, BUN, CREATININE, CALCIUM,  in the last 72 hours   Studies/Results: No results found.  Anti-infectives: Anti-infectives   Start     Dose/Rate Route Frequency Ordered Stop   02/21/14 1800  doxycycline (VIBRAMYCIN) 100 mg in dextrose 5 % 250 mL IVPB  Status:  Discontinued     100 mg 125 mL/hr over 120 Minutes Intravenous Every 12 hours 02/21/14 1641 02/28/14 0934   02/21/14 1800  metroNIDAZOLE (FLAGYL) IVPB 500 mg  Status:  Discontinued     500 mg 100 mL/hr over 60 Minutes Intravenous Every 8 hours 02/21/14 1641 02/28/14 0934   02/21/14 1130  cefoTEtan in Dextrose 5% (CEFOTAN) IVPB 2 g     2 g Intravenous  Once 02/21/14 0816 02/21/14 1111      Assessment/Plan: s/p Procedure(s): LAPAROSCOPIC ASSISTED SIGMOID COLECTOMY WITH MOBILIZATION OF SPLENIC FLEXION, DESCENDING COLOSTOMY, RIGID PROCTOSCOPE Doing well this morning. Ready for discharge.   LOS: 9 days     Braydn Carneiro T 03/02/2014

## 2014-03-02 NOTE — Progress Notes (Signed)
called patient room.  Patient states while she was in bathroom incision began to leak. Dressing saturated with yellowish pink fluid with drips to toilet and floor. Pt states she is not comfortable for discharge . md called. Orders noted

## 2014-03-02 NOTE — Progress Notes (Signed)
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 From home with husband     Action/Plan:   Return home with husband and [place] services   Anticipated DC Date:  02/28/2014   Anticipated DC Plan:  [place]  CM consult      [facility] Choice  HOME HEALTH   Choice offered to / List presented to:  C-1 Patient        [place] arranged  HH-1 RN      [place].   Status of service:  Completed, signed off Medicare Important Message given?   (If response is "NO", the following Medicare IM given date fields will be blank) Date Medicare IM given:   Medicare IM given by:   Date Additional Medicare IM given:   Additional Medicare IM given by:    Discharge Disposition:  [place]  Per UR Regulation:  Reviewed for med. necessity/level of care/duration of stay  If discussed at [place] of Stay Meetings, dates discussed:    Comments:  03/02/2014 1030 NCM notified [facility] of scheduled dc home today with [facility] RN. Pt has ostomy supplies to take home. HH RN will assist with getting ostomy supplies at home.  Jonnie Finner RN  CCM Case Mgmt phone [phone]

## 2014-03-02 NOTE — Progress Notes (Signed)
Husband and daughter have emptied colostomy pouch today. Both  state they are willing and feel comfortable to empty pt's ostomy at home. Husband able to verbally tell me how to change ostomy appliance but chose to wait and demonstrate pouch change prior to discharge tomorrow. Patient has 6- wafers, 6-bags,6- barrier rings, and pattern for discharge. She has choosen [facility] and they are anticipating her discharge.

## 2014-03-02 NOTE — Progress Notes (Signed)
Removed pt staples and applied 1/2 inch steri strips. After proximal 3 staples removed pinkish yellow fluid ran out from incision--saturated four 4x4's. Removed remainder of staples and applied steris without further incident. Continues to have some oozing from upper incision. Dr Excell Seltzer notified.

## 2014-03-03 ENCOUNTER — Telehealth (INDEPENDENT_AMBULATORY_CARE_PROVIDER_SITE_OTHER): Payer: Self-pay

## 2014-03-03 NOTE — Progress Notes (Signed)
Nursing Discharge Summary  Patient ID: TAHLIA DEAMER MRN: [id number] DOB/AGE: [birth year] 58 y.o.  Admit date: 02/21/2014 Discharge date: 03/03/2014  Discharged Condition: good  Disposition: 01-Home or Self Care  Follow-up Information   Follow up with ROSENBOWER,TODD J, MD. Schedule an appointment as soon as possible for a visit in 2 weeks.   Specialty:  General Surgery   Contact information:   [address] [place] [address] [phone]       Follow up with [place]. ([place] )    Contact information:   [address] [place] [address] [phone]       Prescriptions Given: [facility] - given to pt/husband  Means of Discharge: Home with husband via car.  Signed: Juanetta Snow 03/03/2014, 11:27 AM

## 2014-03-03 NOTE — Telephone Encounter (Signed)
Pt has post op appt with Dr. Zella Richer on 03/10/14 at 2:00 p.m.  She was d/c today.

## 2014-03-03 NOTE — Progress Notes (Signed)
10 Days Post-Op  Subjective: Has two areas of wound draining serous fluid after staples removed.  Tolerating diet.  No nausea.   Objective: Vital signs in last 24 hours: Temp:  [98.4 F (36.9 C)-98.6 F (37 C)] 98.6 F (37 C) (08/17 0534) Pulse Rate:  [81-86] 83 (08/17 0534) Resp:  [18] 18 (08/17 0534) BP: (103-114)/(65-78) 103/68 mmHg (08/17 0534) SpO2:  [96 %-98 %] 98 % (08/17 0534) Last BM Date: 03/02/14  Intake/Output from previous day: 08/16 0701 - 08/17 0700 In: 1520 [P.O.:480; I.V.:1040] Out: 3751 [Urine:3150; Stool:601] Intake/Output this shift:    PE: General- In NAD Abdomen-soft, small openings at superior and inferior aspects of lower midline wound draining some serous fluid, no purulent drainage,; area probed with cotton-tipped swab and fascia is intact; L sided ostomy with liquid stool in bag Lab Results:   Pathology:  Diverticular disease, no malignancy No results found for this basename: WBC, HGB, HCT, PLT,  in the last 72 hours BMET No results found for this basename: NA, K, CL, CO2, GLUCOSE, BUN, CREATININE, CALCIUM,  in the last 72 hours PT/INR No results found for this basename: LABPROT, INR,  in the last 72 hours Comprehensive Metabolic Panel:    Component Value Date/Time   NA 136* 02/22/2014 0544   NA 139 02/18/2014 0910   K 4.3 02/22/2014 0544   K 4.3 02/18/2014 0910   CL 100 02/22/2014 0544   CL 101 02/18/2014 0910   CO2 29 02/22/2014 0544   CO2 27 02/18/2014 0910   BUN 5* 02/22/2014 0544   BUN 11 02/18/2014 0910   CREATININE 0.62 02/22/2014 0544   CREATININE 0.60 02/18/2014 0910   GLUCOSE 118* 02/22/2014 0544   GLUCOSE 86 02/18/2014 0910   CALCIUM 8.5 02/22/2014 0544   CALCIUM 9.1 02/18/2014 0910   AST 19 02/18/2014 0910   AST 27 12/16/2013 1230   ALT 15 02/18/2014 0910   ALT 17 12/16/2013 1230   ALKPHOS 75 02/18/2014 0910   ALKPHOS 81 12/16/2013 1230   BILITOT 0.2* 02/18/2014 0910   BILITOT 0.4 12/16/2013 1230   PROT 7.2 02/18/2014 0910   PROT 8.0 12/16/2013 1230   ALBUMIN 3.4*  02/18/2014 0910   ALBUMIN 4.0 12/16/2013 1230     Studies/Results: No results found.  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  Ready for discharge.     LOS: 10 days   Plan:  Discharge today.  [place] arrangements have been made.  Wound care discussed and demonstrated to her and her husband.   Shalon Councilman J 03/03/2014

## 2014-03-10 ENCOUNTER — Ambulatory Visit (INDEPENDENT_AMBULATORY_CARE_PROVIDER_SITE_OTHER): Payer: BC Managed Care – PPO | Admitting: General Surgery

## 2014-03-10 ENCOUNTER — Encounter (INDEPENDENT_AMBULATORY_CARE_PROVIDER_SITE_OTHER): Payer: Self-pay | Admitting: General Surgery

## 2014-03-10 VITALS — BP 126/70 | HR 75 | Temp 97.5°F | Ht 65.0 in | Wt 162.0 lb

## 2014-03-10 DIAGNOSIS — Z4889 Encounter for other specified surgical aftercare: Secondary | ICD-10-CM

## 2014-03-10 NOTE — Progress Notes (Signed)
Procedure:  Laparoscopic assisted Jeanette Caprice procedure  Date:  02/21/14  Pathology:  Diverticular disease.  History:  She is here for her first postop visit.  She is still having some serous drainage from her wound.  HHRN is coming out and helping her.  Tolerating her diet.  No fever or chills.  No vaginal discharge.  Exam: General- Is in NAD. Abd-soft, LLQ colostomy, lower midline incision is clean with a small amount of serous drainage inferiorly  Assessment:  Progressing well overall.  Plan:  Continue light activities.  Return visit in 3 weeks.

## 2014-03-10 NOTE — Patient Instructions (Signed)
Continue current light activities.  Continue current wound care.

## 2014-03-11 NOTE — Discharge Summary (Signed)
Physician Discharge Summary  Patient ID: Chloe Leonard MRN: [id number] DOB/AGE: 1956/06/23 58 y.o.  Admit date: 02/21/2014 Discharge date: 03/03/2014  Admission Diagnoses:  Diverticulitis with colovaginal fistula and persistent abscess  Discharge Diagnoses: same Postoperative ileus    Discharged Condition: good  Hospital Course: she was admitted and underwent a laparoscopic-assisted Hartmann procedure 02/21/2014. Because of the finding of an abscess, she was maintained on IV antibiotics postoperatively. A drain was in as well. She did have somewhat of an ileus but this slowly improved. The wound and ostomy care nurse helped her with colostomy teaching. She did have some serous drainage from the lower aspect of her wound when her staples were removed but there was no evidence of infection.  Her drain was removed. On postoperative day 10 she is tolerating her diet, somewhat comfortable with her colostomy, and ready for discharge. Home health nursing has been arranged. She was given discharge instructions.  Consults: WOC nurse  Significant Diagnostic Studies: none  Treatments: surgery:  Laparoscopic-assisted Hartmann procedure  Discharge Exam: Blood pressure 103/68, pulse 83, temperature 98.6 F (37 C), temperature source Oral, resp. rate 18, height 5\' 5"  (1.651 m), weight 171 lb (77.565 kg), SpO2 98.00%.   Disposition: 06-Home-Health Care Svc  Discharge Instructions   Discharge patient    Complete by:  As directed             Medication List    STOP taking these medications       doxycycline 100 MG capsule  Commonly known as:  VIBRAMYCIN     metroNIDAZOLE 500 MG tablet  Commonly known as:  FLAGYL      TAKE these medications       Calcium-Magnesium-Vitamin D 300-20-200 MG-MG-UNIT Chew  Chew 1 tablet by mouth daily.     cholecalciferol 1000 UNITS tablet  Commonly known as:  VITAMIN D  Take 1,000 Units by mouth daily.     fexofenadine 180 MG tablet  Commonly known  as:  ALLEGRA  Take 180 mg by mouth daily.     fluticasone 50 MCG/ACT nasal spray  Commonly known as:  FLONASE  Place 1 spray into both nostrils daily as needed for allergies or rhinitis.     HYDROcodone-acetaminophen 5-325 MG per tablet  Commonly known as:  NORCO/VICODIN  Take 1-2 tablets by mouth every 4 (four) hours as needed for moderate pain or severe pain.     multivitamin with minerals Tabs tablet  Take 1 tablet by mouth daily.           Follow-up Information   Follow up with Sinclair Alligood J, MD. Schedule an appointment as soon as possible for a visit in 2 weeks.   Specialty:  General Surgery   Contact information:   [address] [place] [address] [phone]       Follow up with [place]. [facility] RN )    Contact information:   [address] [place] [address] [phone]       Signed: Odis Hollingshead 03/11/2014, 12:06 PM

## 2014-03-19 ENCOUNTER — Encounter (INDEPENDENT_AMBULATORY_CARE_PROVIDER_SITE_OTHER): Payer: BC Managed Care – PPO | Admitting: General Surgery

## 2014-03-25 ENCOUNTER — Encounter (INDEPENDENT_AMBULATORY_CARE_PROVIDER_SITE_OTHER): Payer: BC Managed Care – PPO | Admitting: General Surgery

## 2014-03-31 ENCOUNTER — Encounter (INDEPENDENT_AMBULATORY_CARE_PROVIDER_SITE_OTHER): Payer: BC Managed Care – PPO | Admitting: General Surgery

## 2014-05-02 ENCOUNTER — Other Ambulatory Visit: Payer: Self-pay

## 2014-07-04 ENCOUNTER — Other Ambulatory Visit: Payer: Self-pay | Admitting: Family Medicine

## 2014-07-04 DIAGNOSIS — E2839 Other primary ovarian failure: Secondary | ICD-10-CM

## 2014-07-14 ENCOUNTER — Ambulatory Visit
Admission: RE | Admit: 2014-07-14 | Discharge: 2014-07-14 | Disposition: A | Discharge status: Home / Self Care | Payer: BC Managed Care – PPO | Source: Ambulatory Visit | Attending: Family Medicine | Admitting: Family Medicine

## 2014-07-14 DIAGNOSIS — E2839 Other primary ovarian failure: Secondary | ICD-10-CM

## 2014-08-21 ENCOUNTER — Other Ambulatory Visit: Payer: Self-pay

## 2014-08-21 DIAGNOSIS — Z1231 Encounter for screening mammogram for malignant neoplasm of breast: Secondary | ICD-10-CM

## 2014-08-21 IMAGING — CT CT ABD-PELV W/ CM
3 of 5 series · 12 of 36 positions shown, 18 images · IV contrast (READICAT/WATER & [ID] OMNI 300)
Comparison: 12/19/2013, 12/10/2013, 11/25/2013, 10/31/2013.

CLINICAL DATA: 57-year-old with history of diverticulitis and
current rectovaginal fistula. Preoperative evaluation.

EXAM:
CT ABDOMEN AND PELVIS WITH CONTRAST
TECHNIQUE: Multidetector CT imaging of the abdomen and pelvis was performed
using the standard protocol following bolus administration of
intravenous contrast.
CONTRAST:  100mL OMNIPAQUE IOHEXOL 300 MG/ML IV. Oral contrast was
also administered.

[Series 3: abd/pelvis with · axial · 0.70mm/px · z∈[-329,+11]mm · 8 of 88 slices shown, 13 images]
[im 10/88  soft-tissue]
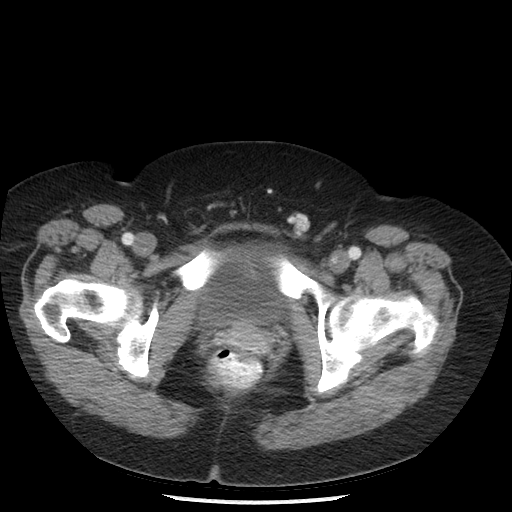
[im 10/88  bone]
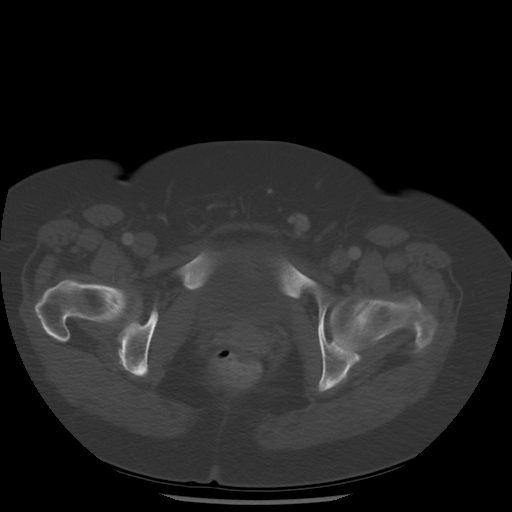
[im 20/88  soft-tissue]
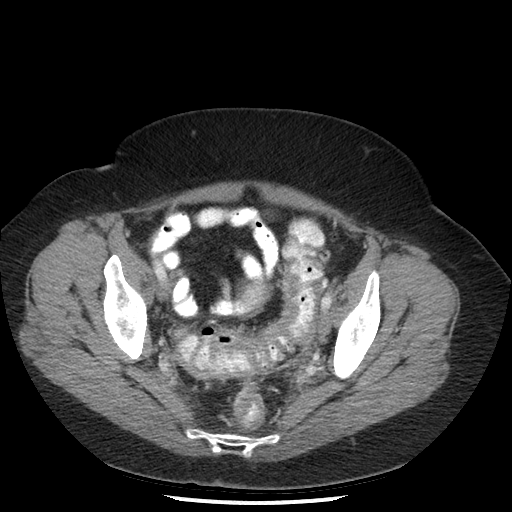
[im 30/88  soft-tissue]
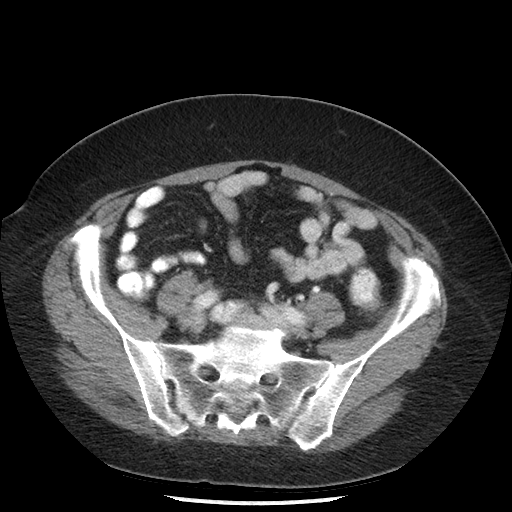
[im 39/88  soft-tissue]
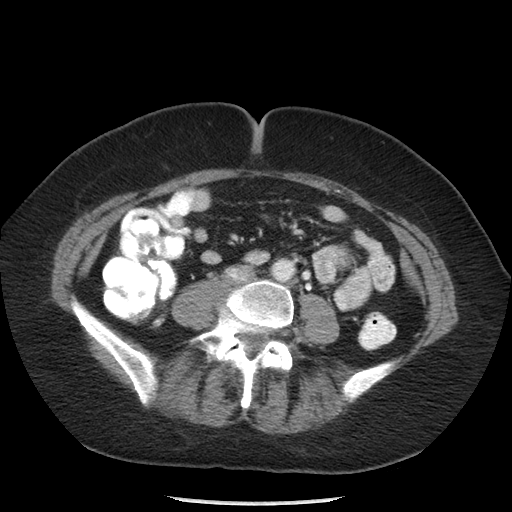
[im 49/88  soft-tissue]
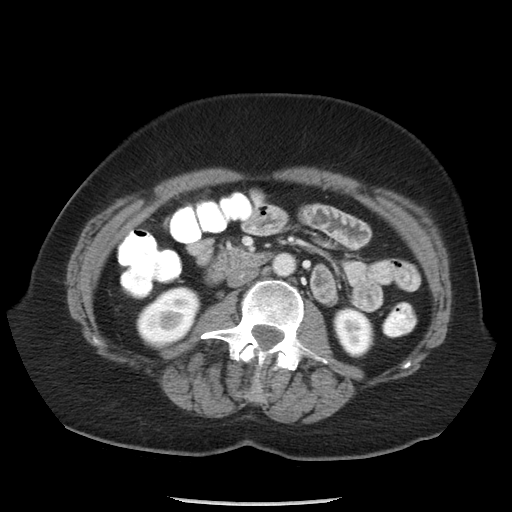
[im 49/88  lung]
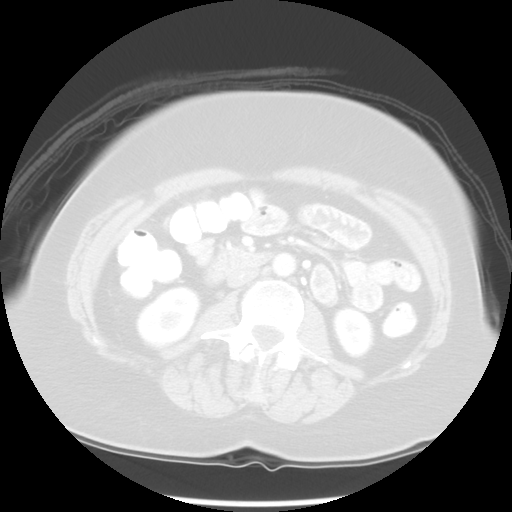
[im 59/88  soft-tissue]
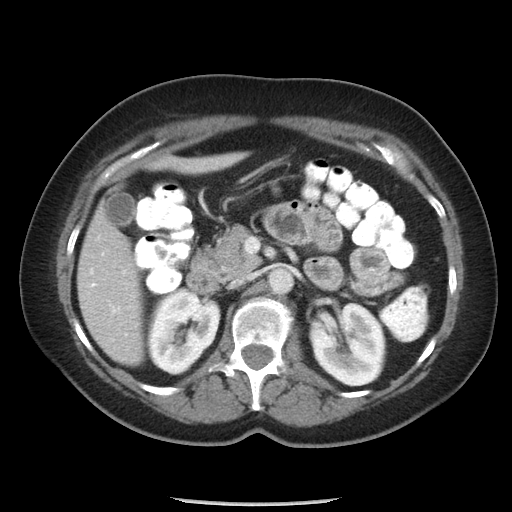
[im 59/88  lung]
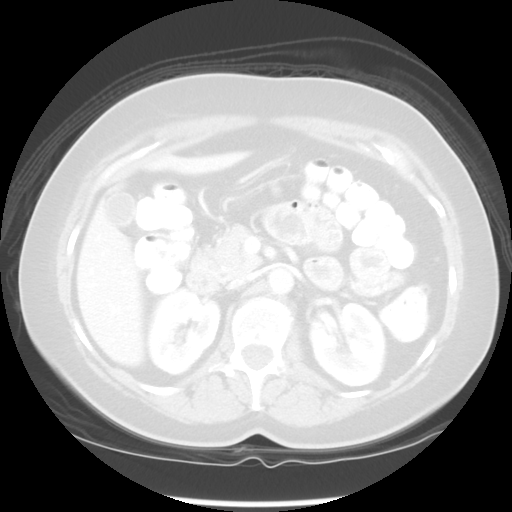
[im 68/88  soft-tissue]
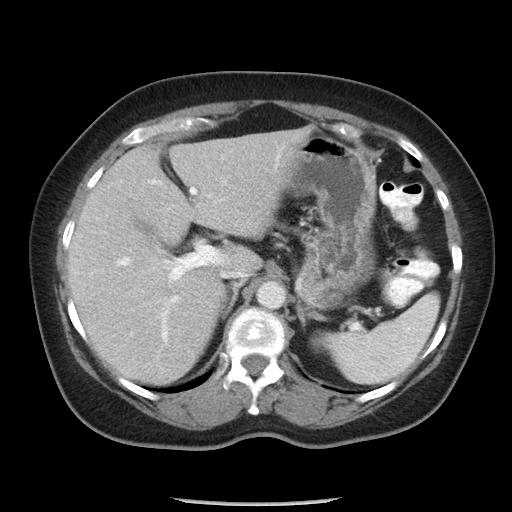
[im 68/88  lung]
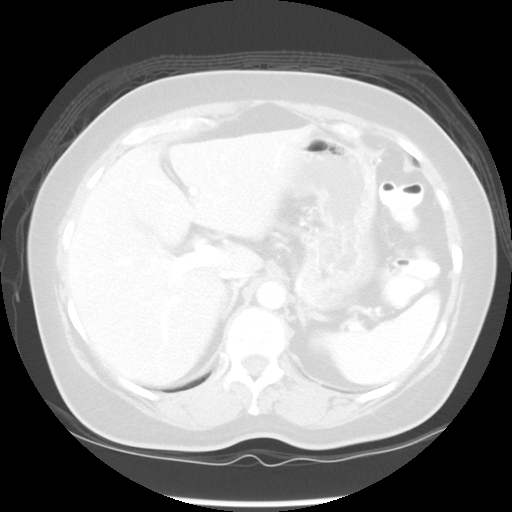
[im 78/88  soft-tissue]
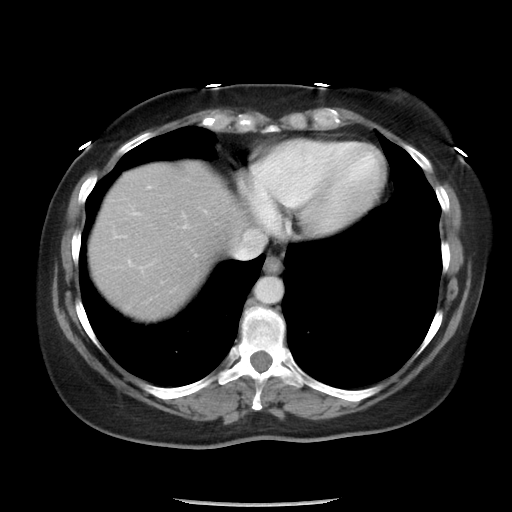
[im 78/88  lung]
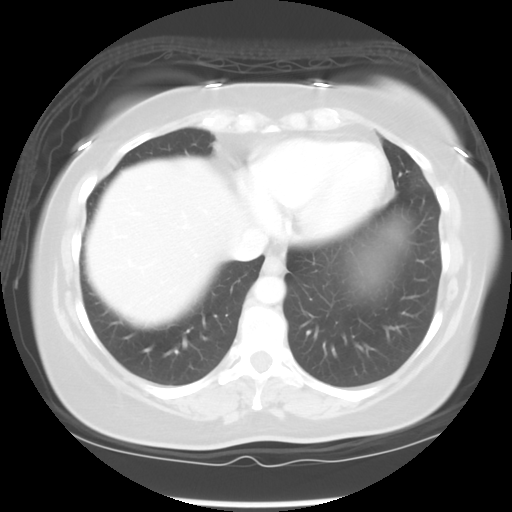

[Series 601: coronal body · coronal · 0.88mm/px · 1 of 125 slices shown, 2 images]
[im 42/125  soft-tissue]
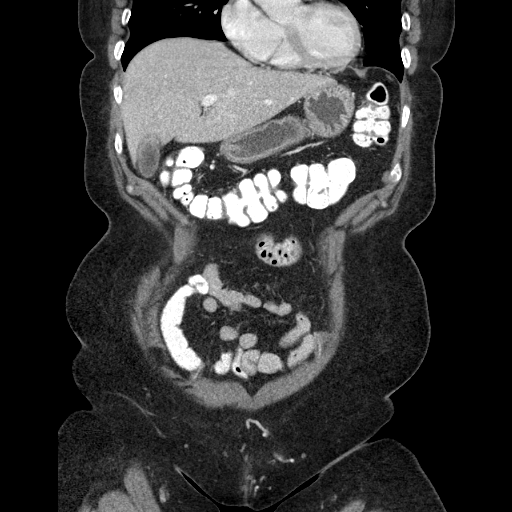
[im 42/125  bone]
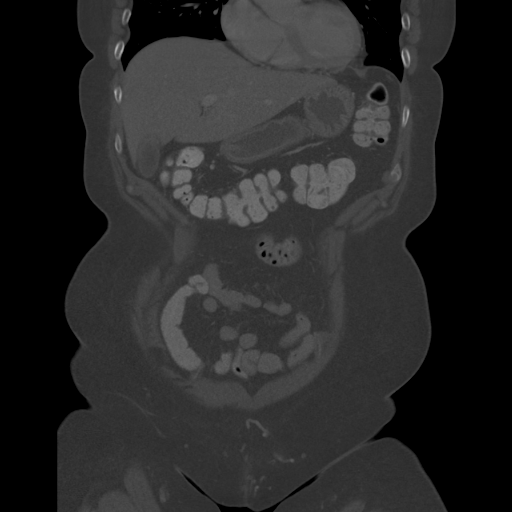

[Series 602: sagittal body · sagittal · 0.88mm/px · 3 of 145 slices shown]
[im 10/145  soft-tissue]
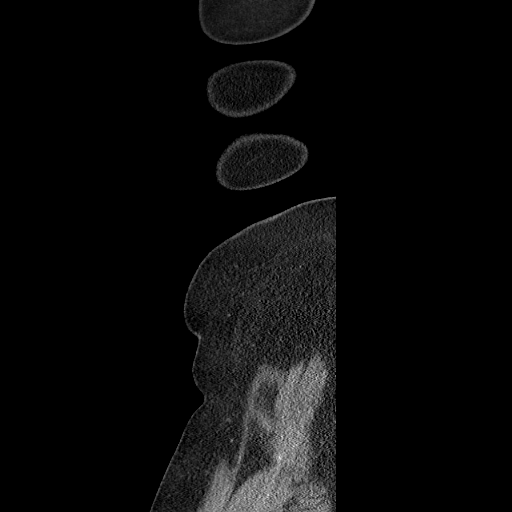
[im 28/145  soft-tissue]
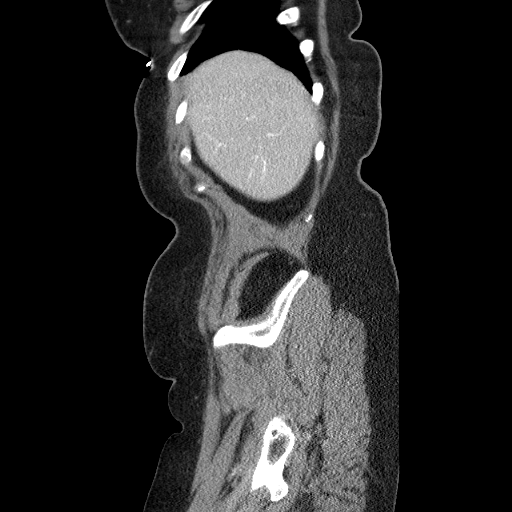
[im 46/145  soft-tissue]
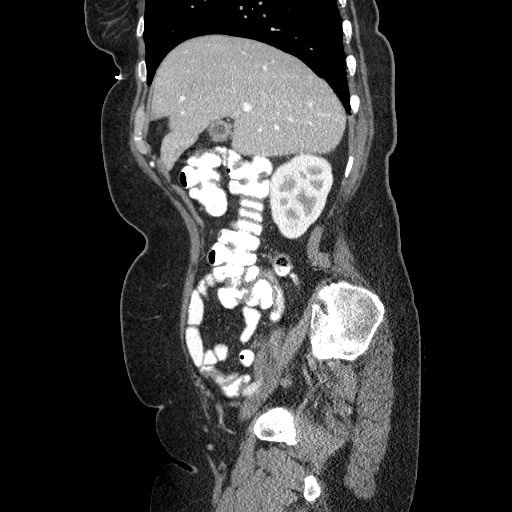

[12 of 36 positions shown; findings below may reference images not displayed]

FINDINGS: Residual gas containing fluid collection in the midline of the lower
pelvis with thickened, enhancing wall, situated between the rectum
and the apex of the vagina, maximum measurements currently
approximating 3.5 x 3.0 cm (series 3, image 74), slightly increased
in size since the most recent examination where it measured
approximately 2 x 1 cm. Residual thickening of the wall of the
proximal and mid sigmoid colon in an area of diverticulosis,
consistent with chronic diverticulitis. No new pelvic abscess.
Scattered diverticula involving the descending colon. Remainder of
the colon normal in appearance. Normal appendix in the right upper
pelvis. Normal appearing stomach and small bowel.

Normal appearing liver, spleen, pancreas, adrenal glands, kidneys,
and gallbladder. No biliary ductal dilation. No visible
aortoiliofemoral atherosclerosis. Widely patent visceral arteries.
No significant lymphadenopathy.

Uterus normal in appearance. No adnexal masses. Small left ovarian
varicocele. Phlebolith low in the left side of the pelvis. Urinary
bladder unremarkable.

Bone window images demonstrate degenerative disc disease at T12-L1
and to a lesser degree L4-5, severe facet degenerative changes at
L4-5 and L5-S1. Visualized lung bases clear. Heart size normal.
IMPRESSION: 1. Interval slight increase in size of the midline pelvic abscess
situated between the rectum and the apex of the vagina, measured
above.
2. Thickening of the wall of the proximal and mid sigmoid colon
consistent with chronic diverticulitis.
3. No new pelvic abscess. No new/acute findings elsewhere in the
abdomen or pelvis.

## 2014-09-03 ENCOUNTER — Ambulatory Visit
Admission: RE | Admit: 2014-09-03 | Discharge: 2014-09-03 | Disposition: A | Discharge status: Home / Self Care | Payer: BLUE CROSS/BLUE SHIELD | Source: Ambulatory Visit

## 2014-09-03 DIAGNOSIS — Z1231 Encounter for screening mammogram for malignant neoplasm of breast: Secondary | ICD-10-CM

## 2014-09-04 ENCOUNTER — Other Ambulatory Visit: Payer: Self-pay | Admitting: Family Medicine

## 2014-09-04 DIAGNOSIS — R928 Other abnormal and inconclusive findings on diagnostic imaging of breast: Secondary | ICD-10-CM

## 2014-09-11 ENCOUNTER — Ambulatory Visit
Admission: RE | Admit: 2014-09-11 | Discharge: 2014-09-11 | Disposition: A | Discharge status: Home / Self Care | Payer: BLUE CROSS/BLUE SHIELD | Source: Ambulatory Visit | Attending: Family Medicine | Admitting: Family Medicine

## 2014-09-11 DIAGNOSIS — R928 Other abnormal and inconclusive findings on diagnostic imaging of breast: Secondary | ICD-10-CM

## 2014-09-24 ENCOUNTER — Other Ambulatory Visit (INDEPENDENT_AMBULATORY_CARE_PROVIDER_SITE_OTHER): Payer: Self-pay | Admitting: General Surgery

## 2014-09-24 DIAGNOSIS — K5790 Diverticulosis of intestine, part unspecified, without perforation or abscess without bleeding: Secondary | ICD-10-CM

## 2014-09-25 ENCOUNTER — Other Ambulatory Visit (INDEPENDENT_AMBULATORY_CARE_PROVIDER_SITE_OTHER): Payer: Self-pay

## 2014-09-25 ENCOUNTER — Other Ambulatory Visit (INDEPENDENT_AMBULATORY_CARE_PROVIDER_SITE_OTHER): Payer: Self-pay | Admitting: General Surgery

## 2014-09-25 DIAGNOSIS — K5790 Diverticulosis of intestine, part unspecified, without perforation or abscess without bleeding: Secondary | ICD-10-CM

## 2014-09-25 NOTE — Addendum Note (Signed)
Addended by: Odis Hollingshead on: 09/25/2014 09:05 AM   Modules accepted: Orders

## 2014-11-12 ENCOUNTER — Ambulatory Visit
Admission: RE | Admit: 2014-11-12 | Discharge: 2014-11-12 | Disposition: A | Discharge status: Home / Self Care | Payer: BLUE CROSS/BLUE SHIELD | Source: Ambulatory Visit | Attending: General Surgery | Admitting: General Surgery

## 2014-11-17 ENCOUNTER — Encounter: Payer: Self-pay | Admitting: General Surgery

## 2014-11-17 NOTE — Progress Notes (Signed)
Chloe Leonard 11/17/2014 8:26 AM Location: [facility] Patient #: [id number] DOB: Jan 23, 1956 Married / Language: English / Race: White Female History of Present Illness Odis Hollingshead MD; 11/17/2014 8:54 AM) Patient words: Colostomy takedown.  The patient is a 59 year old female    Note:She returns to the office today after having her colonoscopy which demonstrated no mass lesions. There are few diverticula near the colostomy. Barium enema did not demonstrate a significant burden of diverticular disease in other parts of the colon. She is feeling well. Her energy level is back to normal. She is eating well. Her daughter is here with her. She still has a little bit of discomfort to the right of the lower midline incision with a slight bulge she states.  Allergies Jeralyn Ruths, CMA; 11/17/2014 8:26 AM) Cortisone *CORTICOSTEROIDS* Augmentin *PENICILLINS*  Medication History Jeralyn Ruths, CMA; 11/17/2014 8:26 AM) Medications Reconciled Calcium-Magnesium-Vitamin D (300-20-200MG -MG-UNIT Tablet Chewable, Oral) Active. Allegra Allergy (180MG  Tablet, Oral) Active. Flonase (50MCG/ACT Suspension, Nasal) Active. Multiple Vitamin (Oral) Active.    Vitals Jearld Fenton Morris CMA; 11/17/2014 8:28 AM) 11/17/2014 8:27 AM Weight: 167.8 lb Height: 65in Body Surface Area: 1.87 m Body Mass Index: 27.92 kg/m Temp.: 98.38F(Oral)  Pulse: 18 (Regular)  BP: 140/82 (Sitting, Left Arm, Standard)     Physical Exam Odis Hollingshead MD; 11/17/2014 8:55 AM)  The physical exam findings are as follows: Note:General: WDWN in NAD. Pleasant and cooperative.  HEENT: [place]/AT, no facial masses  EYES: EOMI, no icterus  NECK: Supple, no obvious mass or thyroid enlargement.  CV: RRR, no murmur, no JVD.  CHEST: Breath sounds equal and clear. Respirations nonlabored.  ABDOMEN: Soft, nontender, nondistended, midline incisions without drainage, LLQ colostomy in  place  MUSCULOSKELETAL: FROM, good muscle tone, no edema  NEUROLOGIC: Alert and oriented, answers questions appropriately.  PSYCHIATRIC: Normal mood, affect , and behavior.    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  Plan: Laparoscopic-assisted colostomy reversal. Preoperative patient education instructions have been explained to her and will be given to her as well. I have explained the procedure and risks. Risks include but are not limited to bleeding, infection, wound problems, anesthesia, anastomotic leak, need for another colostomy or inability to reverse colostomy, need for reoperative surgery, injury to intraabominal organs (such as intestine, spleen, kidney, bladder, ureter, etc.), ileus, irregular bowel habits. She seems to understand and agrees to proceed.  Jackolyn Confer, MD

## 2015-01-12 NOTE — Patient Instructions (Addendum)
TONEA LEIPHART  01/12/2015   Your procedure is scheduled on: 01/16/2015    Report to [facility]  Entrance take [place]  elevators to 3rd floor to  [place] at      [place] AM.  Call this number if you have problems the morning of surgery [phone]   Remember: ONLY 1 PERSON MAY GO WITH YOU TO SHORT STAY TO GET  READY MORNING OF [place].  Do not eat food or drink liquids :After Midnight.     Take these medicines the morning of surgery with A SIP OF WATER: Allegra, Flonase if needed                                You may not have any metal on your body including hair pins and              piercings  Do not wear jewelry, make-up, lotions, powders or perfumes, deodorant             Do not wear nail polish.  Do not shave  48 hours prior to surgery.                Do not bring valuables to the hospital. [place].  Contacts, dentures or bridgework may not be worn into surgery.  Leave suitcase in the car. After surgery it may be brought to your room.         Special Instructions: coughing and deep breathing exercises, leg exercises               Please read over the following fact sheets you were given: _____________________________________________________________________             [facility] - Preparing for Surgery Before surgery, you can play an important role.  Because skin is not sterile, your skin needs to be as free of germs as possible.  You can reduce the number of germs on your skin by washing with CHG (chlorahexidine gluconate) soap before surgery.  CHG is an antiseptic cleaner which kills germs and bonds with the skin to continue killing germs even after washing. Please DO NOT use if you have an allergy to CHG or antibacterial soaps.  If your skin becomes reddened/irritated stop using the CHG and inform your nurse when you arrive at Short Stay. Do not shave (including legs and underarms)  for at least 48 hours prior to the first CHG shower.  You may shave your face/neck. Please follow these instructions carefully:  1.  Shower with CHG Soap the night before surgery and the  morning of Surgery.  2.  If you choose to wash your hair, wash your hair first as usual with your  normal  shampoo.  3.  After you shampoo, rinse your hair and body thoroughly to remove the  shampoo.                           4.  Use CHG as you would any other liquid soap.  You can apply chg directly  to the skin and wash  Gently with a scrungie or clean washcloth.  5.  Apply the CHG Soap to your body ONLY FROM THE NECK DOWN.   Do not use on face/ open                           Wound or open sores. Avoid contact with eyes, ears mouth and genitals (private parts).                       Wash face,  Genitals (private parts) with your normal soap.             6.  Wash thoroughly, paying special attention to the area where your surgery  will be performed.  7.  Thoroughly rinse your body with warm water from the neck down.  8.  DO NOT shower/wash with your normal soap after using and rinsing off  the CHG Soap.                9.  Pat yourself dry with a clean towel.            10.  Wear clean pajamas.            11.  Place clean sheets on your bed the night of your first shower and do not  sleep with pets. Day of Surgery : Do not apply any lotions/deodorants the morning of surgery.  Please wear clean clothes to the hospital/surgery center.  FAILURE TO FOLLOW THESE INSTRUCTIONS MAY RESULT IN THE CANCELLATION OF YOUR SURGERY PATIENT SIGNATURE_________________________________  NURSE SIGNATURE__________________________________  ________________________________________________________________________  WHAT IS A BLOOD TRANSFUSION? Blood Transfusion Information  A transfusion is the replacement of blood or some of its parts. Blood is made up of multiple cells which provide different  functions.  Red blood cells carry oxygen and are used for blood loss replacement.  White blood cells fight against infection.  Platelets control bleeding.  Plasma helps clot blood.  Other blood products are available for specialized needs, such as hemophilia or other clotting disorders. BEFORE THE TRANSFUSION  Who gives blood for transfusions?   Healthy volunteers who are fully evaluated to make sure their blood is safe. This is blood bank blood. Transfusion therapy is the safest it has ever been in the practice of medicine. Before blood is taken from a donor, a complete history is taken to make sure that person has no history of diseases nor engages in risky social behavior (examples are intravenous drug use or sexual activity with multiple partners). The donor's travel history is screened to minimize risk of transmitting infections, such as malaria. The donated blood is tested for signs of infectious diseases, such as HIV and hepatitis. The blood is then tested to be sure it is compatible with you in order to minimize the chance of a transfusion reaction. If you or a relative donates blood, this is often done in anticipation of surgery and is not appropriate for emergency situations. It takes many days to process the donated blood. RISKS AND COMPLICATIONS Although transfusion therapy is very safe and saves many lives, the main dangers of transfusion include:   Getting an infectious disease.  Developing a transfusion reaction. This is an allergic reaction to something in the blood you were given. Every precaution is taken to prevent this. The decision to have a blood transfusion has been considered carefully by your caregiver before blood is given. Blood is not given unless the benefits outweigh  the risks. AFTER THE TRANSFUSION  Right after receiving a blood transfusion, you will usually feel much better and more energetic. This is especially true if your red blood cells have gotten low  (anemic). The transfusion raises the level of the red blood cells which carry oxygen, and this usually causes an energy increase.  The nurse administering the transfusion will monitor you carefully for complications. HOME CARE INSTRUCTIONS  No special instructions are needed after a transfusion. You may find your energy is better. Speak with your caregiver about any limitations on activity for underlying diseases you may have. SEEK MEDICAL CARE IF:   Your condition is not improving after your transfusion.  You develop redness or irritation at the intravenous (IV) site. SEEK IMMEDIATE MEDICAL CARE IF:  Any of the following symptoms occur over the next 12 hours:  Shaking chills.  You have a temperature by mouth above 102 F (38.9 C), not controlled by medicine.  Chest, back, or muscle pain.  People around you feel you are not acting correctly or are confused.  Shortness of breath or difficulty breathing.  Dizziness and fainting.  You get a rash or develop hives.  You have a decrease in urine output.  Your urine turns a dark color or changes to pink, red, or brown. Any of the following symptoms occur over the next 10 days:  You have a temperature by mouth above 102 F (38.9 C), not controlled by medicine.  Shortness of breath.  Weakness after normal activity.  The white part of the eye turns yellow (jaundice).  You have a decrease in the amount of urine or are urinating less often.  Your urine turns a dark color or changes to pink, red, or brown. Document Released: 07/01/2000 Document Revised: 09/26/2011 Document Reviewed: 02/18/2008 [facility] Patient Information 2014 [place], [place].  _______________________________________________________________________

## 2015-01-13 ENCOUNTER — Encounter (HOSPITAL_COMMUNITY)
Admission: RE | Admit: 2015-01-13 | Discharge: 2015-01-13 | Disposition: A | Discharge status: Home / Self Care | Payer: BLUE CROSS/BLUE SHIELD | Source: Ambulatory Visit | Attending: General Surgery | Admitting: General Surgery

## 2015-01-13 ENCOUNTER — Encounter (HOSPITAL_COMMUNITY): Payer: Self-pay

## 2015-01-13 LAB — COMPREHENSIVE METABOLIC PANEL
ALT: 18 U/L (ref 14–54)
AST: 24 U/L (ref 15–41)
Albumin: 3.8 g/dL (ref 3.5–5.0)
Alkaline Phosphatase: 79 U/L (ref 38–126)
Anion gap: 9 (ref 5–15)
BUN: 15 mg/dL (ref 6–20)
CO2: 29 mmol/L (ref 22–32)
Calcium: 9.3 mg/dL (ref 8.9–10.3)
Chloride: 102 mmol/L (ref 101–111)
Creatinine, Ser: 0.67 mg/dL (ref 0.44–1.00)
GFR calc Af Amer: >60 mL/min (ref >60)
GFR calc non Af Amer: >60 mL/min (ref >60)
Glucose, Bld: 95 mg/dL (ref 65–99)
Potassium: 4.7 mmol/L (ref 3.5–5.1)
Sodium: 140 mmol/L (ref 135–145)
Total Bilirubin: 0.4 mg/dL (ref 0.3–1.2)
Total Protein: 6.9 g/dL (ref 6.5–8.1)

## 2015-01-13 LAB — CBC WITH DIFFERENTIAL/PLATELET
Basophils Absolute: 0 10*3/uL (ref 0.0–0.1)
Basophils Relative: 0 % (ref 0–1)
Eosinophils Absolute: 0.2 10*3/uL (ref 0.0–0.7)
Eosinophils Relative: 3 % (ref 0–5)
HCT: 40 % (ref 36.0–46.0)
Hemoglobin: 13 g/dL (ref 12.0–15.0)
Lymphocytes Relative: 35 % (ref 12–46)
Lymphs Abs: 1.9 10*3/uL (ref 0.7–4.0)
MCH: 29.4 pg (ref 26.0–34.0)
MCHC: 32.5 g/dL (ref 30.0–36.0)
MCV: 90.5 fL (ref 78.0–100.0)
Monocytes Absolute: 0.4 10*3/uL (ref 0.1–1.0)
Monocytes Relative: 7 % (ref 3–12)
Neutro Abs: 3.1 10*3/uL (ref 1.7–7.7)
Neutrophils Relative %: 55 % (ref 43–77)
Platelets: 265 10*3/uL (ref 150–400)
RBC: 4.42 MIL/uL (ref 3.87–5.11)
RDW: 13.5 % (ref 11.5–15.5)
WBC: 5.6 10*3/uL (ref 4.0–10.5)

## 2015-01-13 LAB — PROTIME-INR
INR: 0.9 (ref 0.00–1.49)
Prothrombin Time: 12.3 seconds (ref 11.6–15.2)

## 2015-01-15 NOTE — Anesthesia Preprocedure Evaluation (Signed)
Anesthesia Evaluation  Patient identified by MRN, date of birth, ID band Patient awake    Reviewed: Allergy & Precautions, NPO status , Patient's Chart, lab work & pertinent test results  Airway Mallampati: II  TM Distance: >3 FB Neck ROM: Full    Dental no notable dental hx.    Pulmonary neg pulmonary ROS,  breath sounds clear to auscultation  Pulmonary exam normal       Cardiovascular negative cardio ROS Normal cardiovascular examRhythm:Regular Rate:Normal     Neuro/Psych negative neurological ROS  negative psych ROS   GI/Hepatic negative GI ROS, Neg liver ROS,   Endo/Other  negative endocrine ROS  Renal/GU negative Renal ROS  negative genitourinary   Musculoskeletal negative musculoskeletal ROS (+)   Abdominal   Peds negative pediatric ROS (+)  Hematology negative hematology ROS (+)   Anesthesia Other Findings   Reproductive/Obstetrics negative OB ROS                             Anesthesia Physical Anesthesia Plan  ASA: II  Anesthesia Plan: General   Post-op Pain Management:    Induction: Intravenous  Airway Management Planned: Oral ETT  Additional Equipment:   Intra-op Plan:   Post-operative Plan: Extubation in OR  Informed Consent: I have reviewed the patients History and Physical, chart, labs and discussed the procedure including the risks, benefits and alternatives for the proposed anesthesia with the patient or authorized representative who has indicated his/her understanding and acceptance.   Dental advisory given  Plan Discussed with: CRNA and Surgeon  Anesthesia Plan Comments:         Anesthesia Quick Evaluation

## 2015-01-16 ENCOUNTER — Encounter (HOSPITAL_COMMUNITY)
Admission: RE | Disposition: A | Discharge status: Home Health | Payer: Self-pay | Source: Ambulatory Visit | Attending: General Surgery

## 2015-01-16 ENCOUNTER — Inpatient Hospital Stay (HOSPITAL_COMMUNITY): Payer: BLUE CROSS/BLUE SHIELD | Admitting: Anesthesiology

## 2015-01-16 ENCOUNTER — Encounter (HOSPITAL_COMMUNITY): Payer: Self-pay | Admitting: *Deleted

## 2015-01-16 ENCOUNTER — Inpatient Hospital Stay (HOSPITAL_COMMUNITY)
Admission: RE | Admit: 2015-01-16 | Discharge: 2015-01-20 | DRG: 331 | Disposition: A | Discharge status: Home Health | Payer: BLUE CROSS/BLUE SHIELD | Source: Ambulatory Visit | Attending: General Surgery | Admitting: General Surgery

## 2015-01-16 DIAGNOSIS — N736 Female pelvic peritoneal adhesions (postinfective): Secondary | ICD-10-CM | POA: Diagnosis present

## 2015-01-16 DIAGNOSIS — Z79899 Other long term (current) drug therapy: Secondary | ICD-10-CM

## 2015-01-16 DIAGNOSIS — Z808 Family history of malignant neoplasm of other organs or systems: Secondary | ICD-10-CM

## 2015-01-16 DIAGNOSIS — Z433 Encounter for attention to colostomy: Principal | ICD-10-CM

## 2015-01-16 DIAGNOSIS — Z01812 Encounter for preprocedural laboratory examination: Secondary | ICD-10-CM | POA: Diagnosis not present

## 2015-01-16 DIAGNOSIS — Z933 Colostomy status: Secondary | ICD-10-CM

## 2015-01-16 HISTORY — PX: PROCTOSCOPY: SHX2266

## 2015-01-16 HISTORY — PX: LAPAROSCOPIC LYSIS OF ADHESIONS: SHX5905

## 2015-01-16 HISTORY — PX: COLOSTOMY TAKEDOWN: SHX5258

## 2015-01-16 LAB — TYPE AND SCREEN
ABO/RH(D): B POS
Antibody Screen: NEGATIVE

## 2015-01-16 SURGERY — CLOSURE, COLOSTOMY, LAPAROSCOPIC
Anesthesia: General

## 2015-01-16 MED ORDER — BUPIVACAINE-EPINEPHRINE 0.5% -1:200000 IJ SOLN
INTRAMUSCULAR | Status: AC
  Filled 2015-01-16: qty 1

## 2015-01-16 MED ORDER — NEOSTIGMINE METHYLSULFATE 10 MG/10ML IV SOLN
INTRAVENOUS | Status: AC
  Filled 2015-01-16: qty 1

## 2015-01-16 MED ORDER — KETOROLAC TROMETHAMINE 30 MG/ML IJ SOLN
30.0000 mg | Freq: Once | INTRAMUSCULAR | Status: AC | PRN
  Administered 2015-01-16: 30 mg via INTRAVENOUS

## 2015-01-16 MED ORDER — FENTANYL CITRATE (PF) 100 MCG/2ML IJ SOLN
INTRAMUSCULAR | Status: DC | PRN
  Administered 2015-01-16: 50 ug via INTRAVENOUS
  Administered 2015-01-16: 100 ug via INTRAVENOUS
  Administered 2015-01-16 (×2): 50 ug via INTRAVENOUS

## 2015-01-16 MED ORDER — NEOSTIGMINE METHYLSULFATE 10 MG/10ML IV SOLN
INTRAVENOUS | Status: DC | PRN
  Administered 2015-01-16: 4 mg via INTRAVENOUS

## 2015-01-16 MED ORDER — GLYCOPYRROLATE 0.2 MG/ML IJ SOLN
INTRAMUSCULAR | Status: AC
  Filled 2015-01-16: qty 3

## 2015-01-16 MED ORDER — ONDANSETRON HCL 4 MG PO TABS: 4.0000 mg | ORAL_TABLET | Freq: Four times a day (QID) | ORAL | Status: DC | PRN

## 2015-01-16 MED ORDER — EPHEDRINE SULFATE 50 MG/ML IJ SOLN
INTRAMUSCULAR | Status: DC | PRN
  Administered 2015-01-16: 10 mg via INTRAVENOUS

## 2015-01-16 MED ORDER — LACTATED RINGERS IV SOLN: INTRAVENOUS | Status: DC

## 2015-01-16 MED ORDER — LIDOCAINE HCL (PF) 2 % IJ SOLN
INTRAMUSCULAR | Status: DC | PRN
  Administered 2015-01-16: 20 mg via INTRADERMAL

## 2015-01-16 MED ORDER — POTASSIUM CL IN DEXTROSE 5% 20 MEQ/L IV SOLN
20.0000 meq | INTRAVENOUS | Status: DC
  Administered 2015-01-16 – 2015-01-19 (×8): 20 meq via INTRAVENOUS
  Filled 2015-01-16 (×18): qty 1000

## 2015-01-16 MED ORDER — HYDROMORPHONE HCL 1 MG/ML IJ SOLN
INTRAMUSCULAR | Status: DC | PRN
  Administered 2015-01-16 (×2): 1 mg via INTRAVENOUS

## 2015-01-16 MED ORDER — MORPHINE SULFATE 1 MG/ML IV SOLN
INTRAVENOUS | Status: AC
  Filled 2015-01-16: qty 25

## 2015-01-16 MED ORDER — LACTATED RINGERS IR SOLN
Status: DC | PRN
  Administered 2015-01-16: 2000 mL

## 2015-01-16 MED ORDER — PANTOPRAZOLE SODIUM 40 MG IV SOLR
40.0000 mg | INTRAVENOUS | Status: DC
  Administered 2015-01-16 – 2015-01-18 (×3): 40 mg via INTRAVENOUS
  Filled 2015-01-16 (×5): qty 40

## 2015-01-16 MED ORDER — BUPIVACAINE HCL (PF) 0.5 % IJ SOLN
INTRAMUSCULAR | Status: AC
  Filled 2015-01-16: qty 30

## 2015-01-16 MED ORDER — PHENYLEPHRINE HCL 10 MG/ML IJ SOLN
INTRAMUSCULAR | Status: DC | PRN
  Administered 2015-01-16 (×3): 80 ug via INTRAVENOUS

## 2015-01-16 MED ORDER — ONDANSETRON HCL 4 MG/2ML IJ SOLN: 4.0000 mg | INTRAMUSCULAR | Status: DC | PRN

## 2015-01-16 MED ORDER — DIPHENHYDRAMINE HCL 12.5 MG/5ML PO ELIX: 12.5000 mg | ORAL_SOLUTION | Freq: Four times a day (QID) | ORAL | Status: DC | PRN

## 2015-01-16 MED ORDER — GLYCOPYRROLATE 0.2 MG/ML IJ SOLN
INTRAMUSCULAR | Status: DC | PRN
  Administered 2015-01-16: 0.6 mg via INTRAVENOUS

## 2015-01-16 MED ORDER — SODIUM CHLORIDE 0.9 % IJ SOLN: 9.0000 mL | INTRAMUSCULAR | Status: DC | PRN

## 2015-01-16 MED ORDER — LACTATED RINGERS IV SOLN
INTRAVENOUS | Status: DC | PRN
  Administered 2015-01-16 (×3): via INTRAVENOUS

## 2015-01-16 MED ORDER — ROCURONIUM BROMIDE 100 MG/10ML IV SOLN
INTRAVENOUS | Status: AC
  Filled 2015-01-16: qty 1

## 2015-01-16 MED ORDER — CEFOTETAN DISODIUM-DEXTROSE 2-2.08 GM-% IV SOLR
2.0000 g | INTRAVENOUS | Status: AC
  Administered 2015-01-16: 2 g via INTRAVENOUS

## 2015-01-16 MED ORDER — DEXAMETHASONE SODIUM PHOSPHATE 10 MG/ML IJ SOLN
INTRAMUSCULAR | Status: DC | PRN
  Administered 2015-01-16: 10 mg via INTRAVENOUS

## 2015-01-16 MED ORDER — ROCURONIUM BROMIDE 100 MG/10ML IV SOLN
INTRAVENOUS | Status: DC | PRN
  Administered 2015-01-16: 10 mg via INTRAVENOUS
  Administered 2015-01-16: 50 mg via INTRAVENOUS
  Administered 2015-01-16: 20 mg via INTRAVENOUS
  Administered 2015-01-16 (×2): 10 mg via INTRAVENOUS

## 2015-01-16 MED ORDER — ALVIMOPAN 12 MG PO CAPS
12.0000 mg | ORAL_CAPSULE | Freq: Two times a day (BID) | ORAL | Status: DC
  Administered 2015-01-17 – 2015-01-18 (×4): 12 mg via ORAL
  Filled 2015-01-16 (×8): qty 1

## 2015-01-16 MED ORDER — PROPOFOL 10 MG/ML IV BOLUS
INTRAVENOUS | Status: DC | PRN
  Administered 2015-01-16: 120 mg via INTRAVENOUS

## 2015-01-16 MED ORDER — HYDROMORPHONE HCL 1 MG/ML IJ SOLN
INTRAMUSCULAR | Status: AC
  Filled 2015-01-16: qty 1

## 2015-01-16 MED ORDER — PROMETHAZINE HCL 25 MG/ML IJ SOLN: 6.2500 mg | INTRAMUSCULAR | Status: DC | PRN

## 2015-01-16 MED ORDER — ONDANSETRON HCL 4 MG/2ML IJ SOLN
INTRAMUSCULAR | Status: DC | PRN
  Administered 2015-01-16: 4 mg via INTRAVENOUS

## 2015-01-16 MED ORDER — DEXTROSE 5 % IV SOLN
2.0000 g | Freq: Two times a day (BID) | INTRAVENOUS | Status: AC
  Administered 2015-01-16: 2 g via INTRAVENOUS
  Filled 2015-01-16: qty 2

## 2015-01-16 MED ORDER — LIDOCAINE HCL (CARDIAC) 20 MG/ML IV SOLN
INTRAVENOUS | Status: AC
  Filled 2015-01-16: qty 5

## 2015-01-16 MED ORDER — FENTANYL CITRATE (PF) 250 MCG/5ML IJ SOLN
INTRAMUSCULAR | Status: AC
  Filled 2015-01-16: qty 5

## 2015-01-16 MED ORDER — HYDROMORPHONE HCL 2 MG/ML IJ SOLN
INTRAMUSCULAR | Status: AC
  Filled 2015-01-16: qty 1

## 2015-01-16 MED ORDER — EPHEDRINE SULFATE 50 MG/ML IJ SOLN
INTRAMUSCULAR | Status: AC
  Filled 2015-01-16: qty 1

## 2015-01-16 MED ORDER — NALOXONE HCL 0.4 MG/ML IJ SOLN: 0.4000 mg | INTRAMUSCULAR | Status: DC | PRN

## 2015-01-16 MED ORDER — SODIUM CHLORIDE 0.9 % IJ SOLN
INTRAMUSCULAR | Status: AC
  Filled 2015-01-16: qty 10

## 2015-01-16 MED ORDER — PHENYLEPHRINE 40 MCG/ML (10ML) SYRINGE FOR IV PUSH (FOR BLOOD PRESSURE SUPPORT)
PREFILLED_SYRINGE | INTRAVENOUS | Status: AC
  Filled 2015-01-16: qty 10

## 2015-01-16 MED ORDER — HEPARIN SODIUM (PORCINE) 5000 UNIT/ML IJ SOLN
5000.0000 [IU] | Freq: Three times a day (TID) | INTRAMUSCULAR | Status: DC
  Administered 2015-01-17 – 2015-01-20 (×9): 5000 [IU] via SUBCUTANEOUS
  Filled 2015-01-16 (×12): qty 1

## 2015-01-16 MED ORDER — MIDAZOLAM HCL 5 MG/5ML IJ SOLN
INTRAMUSCULAR | Status: DC | PRN
  Administered 2015-01-16: 2 mg via INTRAVENOUS

## 2015-01-16 MED ORDER — CEFOTETAN DISODIUM-DEXTROSE 2-2.08 GM-% IV SOLR
INTRAVENOUS | Status: AC
  Filled 2015-01-16: qty 50

## 2015-01-16 MED ORDER — PROPOFOL 10 MG/ML IV BOLUS
INTRAVENOUS | Status: AC
  Filled 2015-01-16: qty 20

## 2015-01-16 MED ORDER — DEXAMETHASONE SODIUM PHOSPHATE 10 MG/ML IJ SOLN
INTRAMUSCULAR | Status: AC
  Filled 2015-01-16: qty 1

## 2015-01-16 MED ORDER — KETOROLAC TROMETHAMINE 30 MG/ML IJ SOLN
INTRAMUSCULAR | Status: AC
  Filled 2015-01-16: qty 1

## 2015-01-16 MED ORDER — MORPHINE SULFATE 1 MG/ML IV SOLN
INTRAVENOUS | Status: DC
  Administered 2015-01-16: 9 mg via INTRAVENOUS
  Administered 2015-01-16: 1 mg via INTRAVENOUS
  Administered 2015-01-16: 8 mg via INTRAVENOUS
  Administered 2015-01-17: 1 mg via INTRAVENOUS
  Administered 2015-01-17 (×2): 2 mg via INTRAVENOUS
  Administered 2015-01-17: 8 mg via INTRAVENOUS
  Administered 2015-01-17: 0 mg via INTRAVENOUS
  Administered 2015-01-17: 10 mg via INTRAVENOUS
  Administered 2015-01-18 (×2): 0 mg via INTRAVENOUS
  Administered 2015-01-18: 2 mg via INTRAVENOUS
  Administered 2015-01-18: 0.999 mg via INTRAVENOUS
  Administered 2015-01-19: 5 mg via INTRAVENOUS
  Filled 2015-01-16: qty 25

## 2015-01-16 MED ORDER — 0.9 % SODIUM CHLORIDE (POUR BTL) OPTIME
TOPICAL | Status: DC | PRN
  Administered 2015-01-16: 2000 mL

## 2015-01-16 MED ORDER — ALVIMOPAN 12 MG PO CAPS
12.0000 mg | ORAL_CAPSULE | Freq: Once | ORAL | Status: AC
  Administered 2015-01-16: 12 mg via ORAL
  Filled 2015-01-16: qty 1

## 2015-01-16 MED ORDER — BUPIVACAINE HCL (PF) 0.5 % IJ SOLN
INTRAMUSCULAR | Status: DC | PRN
  Administered 2015-01-16: 15 mL

## 2015-01-16 MED ORDER — ONDANSETRON HCL 4 MG/2ML IJ SOLN: 4.0000 mg | Freq: Four times a day (QID) | INTRAMUSCULAR | Status: DC | PRN

## 2015-01-16 MED ORDER — MIDAZOLAM HCL 2 MG/2ML IJ SOLN
INTRAMUSCULAR | Status: AC
  Filled 2015-01-16: qty 2

## 2015-01-16 MED ORDER — HYDROMORPHONE HCL 1 MG/ML IJ SOLN
0.2500 mg | INTRAMUSCULAR | Status: DC | PRN
  Administered 2015-01-16 (×4): 0.25 mg via INTRAVENOUS

## 2015-01-16 MED ORDER — ONDANSETRON HCL 4 MG/2ML IJ SOLN
INTRAMUSCULAR | Status: AC
Start: 2015-01-16 — End: 2015-01-16
  Filled 2015-01-16: qty 2

## 2015-01-16 MED ORDER — DIPHENHYDRAMINE HCL 50 MG/ML IJ SOLN: 12.5000 mg | Freq: Four times a day (QID) | INTRAMUSCULAR | Status: DC | PRN

## 2015-01-16 SURGICAL SUPPLY — 76 items
APPLIER CLIP 5 13 M/L LIGAMAX5 (MISCELLANEOUS)
APPLIER CLIP ROT 10 11.4 M/L (STAPLE)
APR CLP MED LRG 11.4X10 (STAPLE)
APR CLP MED LRG 5 ANG JAW (MISCELLANEOUS)
BLADE EXTENDED COATED 6.5IN (ELECTRODE) IMPLANT
BLADE SURG SZ10 CARB STEEL (BLADE) ×3 IMPLANT
CABLE HIGH FREQUENCY MONO STRZ (ELECTRODE) IMPLANT
CELLS DAT CNTRL 66122 CELL SVR (MISCELLANEOUS) IMPLANT
CHLORAPREP W/TINT 26ML (MISCELLANEOUS) ×2 IMPLANT
CLIP APPLIE 5 13 M/L LIGAMAX5 (MISCELLANEOUS) IMPLANT
CLIP APPLIE ROT 10 11.4 M/L (STAPLE) IMPLANT
COVER MAYO STAND STRL (DRAPES) ×5 IMPLANT
DECANTER SPIKE VIAL GLASS SM (MISCELLANEOUS) ×2 IMPLANT
DISSECTOR BLUNT TIP ENDO 5MM (MISCELLANEOUS) ×1 IMPLANT
DRAIN CHANNEL 19F RND (DRAIN) ×1 IMPLANT
DRAPE LAPAROSCOPIC ABDOMINAL (DRAPES) ×3 IMPLANT
DRAPE SHEET LG 3/4 BI-LAMINATE (DRAPES) IMPLANT
DRAPE UTILITY XL STRL (DRAPES) ×3 IMPLANT
DRAPE WARM FLUID 44X44 (DRAPE) ×3 IMPLANT
DRSG OPSITE POSTOP 4X10 (GAUZE/BANDAGES/DRESSINGS) IMPLANT
DRSG OPSITE POSTOP 4X6 (GAUZE/BANDAGES/DRESSINGS) IMPLANT
DRSG OPSITE POSTOP 4X8 (GAUZE/BANDAGES/DRESSINGS) IMPLANT
DRSG TEGADERM 2-3/8X2-3/4 SM (GAUZE/BANDAGES/DRESSINGS) IMPLANT
DRSG TEGADERM 4X4.75 (GAUZE/BANDAGES/DRESSINGS) ×3 IMPLANT
ELECT PENCIL ROCKER SW 15FT (MISCELLANEOUS) ×5 IMPLANT
EVACUATOR SILICONE 100CC (DRAIN) ×1 IMPLANT
FILTER SMOKE EVAC LAPAROSHD (FILTER) ×1 IMPLANT
GAUZE SPONGE 2X2 8PLY STRL LF (GAUZE/BANDAGES/DRESSINGS) IMPLANT
GAUZE SPONGE 4X4 12PLY STRL (GAUZE/BANDAGES/DRESSINGS) ×3 IMPLANT
GLOVE ECLIPSE 8.0 STRL XLNG CF (GLOVE) ×7 IMPLANT
GLOVE INDICATOR 8.0 STRL GRN (GLOVE) ×6 IMPLANT
GOWN STRL REUS W/TWL XL LVL3 (GOWN DISPOSABLE) ×20 IMPLANT
KIT BASIN OR (CUSTOM PROCEDURE TRAY) ×2 IMPLANT
LEGGING LITHOTOMY PAIR STRL (DRAPES) ×3 IMPLANT
LIGASURE IMPACT 36 18CM CVD LR (INSTRUMENTS) IMPLANT
LUBRICANT JELLY K Y 4OZ (MISCELLANEOUS) ×1 IMPLANT
PACK COLON (CUSTOM PROCEDURE TRAY) ×1 IMPLANT
PACK GENERAL/GYN (CUSTOM PROCEDURE TRAY) ×2 IMPLANT
PAD POSITIONING PINK XL (MISCELLANEOUS) ×1 IMPLANT
RETRACTOR WND ALEXIS 18 MED (MISCELLANEOUS) IMPLANT
RTRCTR WOUND ALEXIS 18CM MED (MISCELLANEOUS)
SCISSORS LAP 5X35 DISP (ENDOMECHANICALS) ×3 IMPLANT
SET IRRIG TUBING LAPAROSCOPIC (IRRIGATION / IRRIGATOR) ×3 IMPLANT
SHEARS HARMONIC ACE PLUS 36CM (ENDOMECHANICALS) ×1 IMPLANT
SLEEVE XCEL OPT CAN 5 100 (ENDOMECHANICALS) ×8 IMPLANT
SOLUTION ANTI FOG 6CC (MISCELLANEOUS) ×3 IMPLANT
SPONGE GAUZE 2X2 STER 10/PKG (GAUZE/BANDAGES/DRESSINGS)
SPONGE LAP 18X18 X RAY DECT (DISPOSABLE) IMPLANT
STAPLER CIRC ILS CVD 25MM (STAPLE) ×1 IMPLANT
STAPLER PROXIMATE 75MM BLUE (STAPLE) ×1 IMPLANT
STAPLER VISISTAT 35W (STAPLE) ×3 IMPLANT
SUCTION POOLE TIP (SUCTIONS) ×2 IMPLANT
SUT ETHILON 3 0 PS 1 (SUTURE) ×1 IMPLANT
SUT MNCRL AB 4-0 PS2 18 (SUTURE) ×2 IMPLANT
SUT PDS AB 1 CTX 36 (SUTURE) ×2 IMPLANT
SUT PDS AB 1 TP1 96 (SUTURE) IMPLANT
SUT PROLENE 2 0 SH DA (SUTURE) ×1 IMPLANT
SUT SILK 2 0 (SUTURE) ×3
SUT SILK 2 0 SH CR/8 (SUTURE) ×3 IMPLANT
SUT SILK 2-0 18XBRD TIE 12 (SUTURE) ×2 IMPLANT
SUT SILK 3 0 (SUTURE) ×3
SUT SILK 3 0 SH CR/8 (SUTURE) ×3 IMPLANT
SUT SILK 3-0 18XBRD TIE 12 (SUTURE) ×2 IMPLANT
SYS LAPSCP GELPORT 120MM (MISCELLANEOUS) ×3
SYSTEM LAPSCP GELPORT 120MM (MISCELLANEOUS) IMPLANT
TAPE CLOTH SURG 4X10 WHT LF (GAUZE/BANDAGES/DRESSINGS) ×1 IMPLANT
TOWEL OR 17X26 10 PK STRL BLUE (TOWEL DISPOSABLE) ×6 IMPLANT
TOWEL OR NON WOVEN STRL DISP B (DISPOSABLE) ×5 IMPLANT
TRAY FOLEY W/METER SILVER 14FR (SET/KITS/TRAYS/PACK) ×3 IMPLANT
TRAY LAPAROSCOPIC (CUSTOM PROCEDURE TRAY) ×2 IMPLANT
TROCAR BLADELESS OPT 5 100 (ENDOMECHANICALS) ×3 IMPLANT
TROCAR XCEL BLUNT TIP 100MML (ENDOMECHANICALS) IMPLANT
TROCAR XCEL NON-BLD 11X100MML (ENDOMECHANICALS) IMPLANT
TUBING CONNECTING 10 (TUBING) IMPLANT
TUBING INSUFFLATION 10FT LAP (TUBING) ×3 IMPLANT
YANKAUER SUCT BULB TIP 10FT TU (MISCELLANEOUS) ×2 IMPLANT

## 2015-01-16 NOTE — Anesthesia Postprocedure Evaluation (Signed)
  Anesthesia Post-op Note  Patient: Chloe Leonard  Procedure(s) Performed: Procedure(s) (LRB): LAPAROSCOPIC LYSIS OF ADHESIONS COLOSTOMY CLOSURE MOBILIZATION OF SPLENIC FLEXURE PROCTOSCOPY (N/A) PROCTOSCOPY LAPAROSCOPIC LYSIS OF ADHESIONS  Patient Location: PACU  Anesthesia Type: General  Level of Consciousness: awake and alert   Airway and Oxygen Therapy: Patient Spontanous Breathing  Post-op Pain: mild  Post-op Assessment: Post-op Vital signs reviewed, Patient's Cardiovascular Status Stable, Respiratory Function Stable, Patent Airway and No signs of Nausea or vomiting  Last Vitals:  Filed Vitals:   01/16/15 1826  BP: 110/66  Pulse: 82  Temp:   Resp: 18    Post-op Vital Signs: stable   Complications: No apparent anesthesia complications

## 2015-01-16 NOTE — Anesthesia Procedure Notes (Signed)
Procedure Name: Intubation Date/Time: 01/16/2015 7:40 AM Performed by: Lajuana Carry E Pre-anesthesia Checklist: Patient identified, Emergency Drugs available, Suction available and Patient being monitored Patient Re-evaluated:Patient Re-evaluated prior to inductionOxygen Delivery Method: Circle System Utilized Preoxygenation: Pre-oxygenation with 100% oxygen Intubation Type: IV induction Ventilation: Mask ventilation without difficulty Laryngoscope Size: Miller and 2 Grade View: Grade I Tube type: Oral Number of attempts: 1 Airway Equipment and Method: Stylet Placement Confirmation: ETT inserted through vocal cords under direct vision,  positive ETCO2 and breath sounds checked- equal and bilateral Secured at: 21 cm Tube secured with: Tape Dental Injury: Teeth and Oropharynx as per pre-operative assessment

## 2015-01-16 NOTE — Op Note (Signed)
Operative Note  Chloe Leonard female 59 y.o. 01/16/2015  PREOPERATIVE DX:  Colostomy in place  POSTOPERATIVE DX:  Same  PROCEDURE:   Laparoscopic lysis of adhesions for 1 hour and 30 minutes. Laparoscopic mobilization of splenic flexure. Laparoscopic colostomy closure with segmental colectomy. Proctosigmoidoscopy.         Surgeon: Odis Hollingshead   Assistants: Armandina Gemma M.D.  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 This was approximately 10 months ago. She has done well and now presents for colostomy closure. The colovaginal fistula was taken down at the time of the previous surgery. Barium enema does not demonstrate any evidence of recurrent fistula.    Procedure Detail:  She was brought to the operating room and placed supine on the operating table and a general anesthetic was administered. She was placed in the lithotomy position. Foley catheter and oral gastric tube were inserted. Here in the lower abdominal wall was clipped. Colostomy was exposed and cleansed and covered with a sterile sponge and Tegaderm. Abdominal wall and pelvic and peroneal areas were sterilely prepped and draped.  A 5 mm incision was made in the right upper quadrant. Using a 5 mm Optiview trocar and laparoscope, access was gained into the peritoneal cavity. There is no evidence of bleeding or organ injury underneath the trocar. A 5 mm trocar was placed in the right abdomen and in the right lower quadrant. Adhesions between the omentum and abdominal wall were taken down. Eventration of the abdominal wall was noted at the site of the previous lower midline incision. In the pelvis, there were adhesions between the small bowel and pelvic sidewalls. These were lysed sharply and the small bowel was mobilized out of the pelvis. The suture marking the rectal stump was identified.   A 5 mm trocar was placed in the lower midline and one in the left lower quadrant to help with mobilization.  There were dense adhesions between the rectal stump and pelvis area they were carefully mobilized and lysed sharply and bluntly.  Once I had good mobility of the rectum from the anterior right posterior areas as well as the left side, a size 25 EEA sizer was then placed through the anus. Digital rectal exam had demonstrated an area of slight narrowing and initially we were not able to place the sizer through this area. However, the proctosigmoidoscope easily traversed this area and was noted at the rectal stump line. Sequential and careful dilatation with EEA sizers was then performed. This allowed for passage of a size 25 EEA to the rectal staple line. I then checked the rectal stump for leaks by putting it under water and insufflating air into the anus. There was no evidence of leak.  Next, I mobilized the small bowel around by lysing adhesions around the colostomy and then mobilized the descending colon and the splenic flexure proximally using sharp and blunt dissection. I then approached the left lower quadrant colostomy and made a elliptical incision around it. It was dissected free from the subcutaneous tissues and completely freed up. The anvil from the 25 EEA staplers and placed in the colon. I then resected part of colostomy with the GIA stapler. I then brought the anvil out the side of the descending colon and anchored it there with a 2-0 Prolene suture.  The GelPort was then placed through the colostomy site. I then performed an EEA anastomosis coming  out the anterior wall of the rectum the connecting it to the descending colon laparoscopically. 2 complete donuts were noted. This was done with a #25 EEA stapler. Air leak test was performed by occluding the descending colon proximal to the anastomosis and insufflating air. There is no evidence of leak. The anastomosis was patent, viable, and  under no tension.  I copiously irrigated out the abdominal cavity. I inspected the abdominal cavity and noted no evidence of bleeding or organ injury.  A size 19 Blake drain was placed through the GelPort and brought out the left lower quadrant trocar site. It was positioned posterior to the rectum in the pelvis and then anchored to the skin with 3-0 nylon suture. Trocars were then removed and the GelPort was removed.  The fascia of the colostomy site was then closed with running #1 PDS suture. The colostomy site wound was left open and packed with saline moistened gauze followed by dry dressing. The trocar skin incisions were closed with 4-0 Monocryl subcuticular stitches followed by Steri-Strips and sterile dressings.  She tolerated the procedure well without any apparent complications and was taken to the recovery room in satisfactory condition.   Estimated Blood Loss:  300 mL         Drains: #19 Blake drain placed in the pelvis         Specimens: Colostomy        Complications:  * No complications entered in OR log *         Disposition: PACU - hemodynamically stable.         Condition: stable

## 2015-01-16 NOTE — Discharge Instructions (Signed)
[facility], [place] [phone]  OPEN ABDOMINAL SURGERY: POST OP INSTRUCTIONS  Always review your discharge instruction sheet given to you by the facility where your surgery was performed.  IF YOU HAVE DISABILITY OR FAMILY LEAVE FORMS, YOU MUST BRING THEM TO THE OFFICE FOR PROCESSING.  PLEASE DO NOT GIVE THEM TO YOUR DOCTOR.  1. A prescription for pain medication may be given to you upon discharge.  Take your pain medication as prescribed, if needed.  If narcotic pain medicine is not needed, then you may take acetaminophen (Tylenol) or ibuprofen (Advil) as needed. 2. Take your usually prescribed medications unless otherwise directed. 3. If you need a refill on your pain medication, please contact your pharmacy. They will contact our office to request authorization.  Prescriptions will not be filled after 5pm or on week-ends. 4. You should follow a light diet the first few days after arrival home, such as soup and crackers, pudding, etc.unless your doctor has advised otherwise. A high-fiber, low fat diet can be resumed as tolerated.   Be sure to include lots of fluids daily. Most patients will experience some swelling and bruising on the chest and neck area.  Ice packs will help.  Swelling and bruising can take several days to resolve 5. Most patients will experience some swelling and bruising in the area of the incision. Ice pack will help. Swelling and bruising can take several days to resolve..  6. It is common to experience some constipation if taking pain medication after surgery.  Increasing fluid intake and taking a stool softener will usually help or prevent this problem from occurring.  A mild laxative (Milk of Magnesia or Miralax) should be taken according to package directions if there are no bowel movements after 48 hours. 7.  You may have steri-strips (small skin tapes) in place directly over the incision.  These strips should be left on the skin for 14 days.  If your  surgeon used skin glue on the incision, you may shower in 24 hours.  The glue will flake off over the next 2-3 weeks.  Any sutures or staples will be removed at the office during your follow-up visit. You may find that a light gauze bandage over your incision may keep your staples from being rubbed or pulled. You may shower and replace the bandage daily. 8. ACTIVITIES:  You may resume regular (light) daily activities beginning the next day--such as daily self-care, walking, climbing stairs--gradually increasing activities as tolerated.  You may have sexual intercourse when it is comfortable.  Refrain from any heavy lifting or straining until colostomy site wound is healed. a. You may drive when you no longer are taking prescription pain medication, you can comfortably wear a seatbelt, and you can safely maneuver your car and apply brakes b. Return to Work: _When release by MD__________________________________ 9. You should see your doctor in the office for a follow-up appointment approximately two weeks after your surgery.  Make sure that you call for this appointment within a day or two after you arrive home to insure a convenient appointment time. OTHER INSTRUCTIONS:  _Saline damp to dry dressing changes to colostomy site daily.____________________________________________________________ _____________________________________________________________  WHEN TO CALL YOUR DOCTOR: 1. Fever over 101.0 2. Inability to urinate 3. Nausea and/or vomiting 4. Extreme swelling or bruising 5. Continued bleeding from incision. 6. Increased pain, redness, or drainage from the incisions.   The clinic staff is available to answer your questions during regular business hours.  Please dont hesitate to call and ask to speak to one of the nurses if you have concerns.  For further questions, please visit [email or web address]

## 2015-01-16 NOTE — Transfer of Care (Signed)
Immediate Anesthesia Transfer of Care Note  Patient: Chloe Leonard  Procedure(s) Performed: Procedure(s): LAPAROSCOPIC LYSIS OF ADHESIONS COLOSTOMY CLOSURE MOBILIZATION OF SPLENIC FLEXURE PROCTOSCOPY (N/A) PROCTOSCOPY LAPAROSCOPIC LYSIS OF ADHESIONS  Patient Location: PACU  Anesthesia Type:General  Level of Consciousness:  sedated, patient cooperative and responds to stimulation  Airway & Oxygen Therapy:Patient Spontanous Breathing and Patient connected to face mask oxgen  Post-op Assessment:  Report given to PACU RN and Post -op Vital signs reviewed and stable  Post vital signs:  Reviewed and stable  Last Vitals:  Filed Vitals:   01/16/15 0537  BP: 123/69  Pulse: 70  Temp: 36.4 C  Resp: 18    Complications: No apparent anesthesia complications

## 2015-01-16 NOTE — Interval H&P Note (Signed)
History and Physical Interval Note:  01/16/2015 7:18 AM  Chloe Leonard  has presented today for surgery, with the diagnosis of COLOSTOMY IN PLACE  The various methods of treatment have been discussed with the patient and family. After consideration of risks, benefits and other options for treatment, the patient has consented to  Procedure(s): LAPAROSCOPIC COLOSTOMY TAKEDOWN (N/A) as a surgical intervention .  The patient's history has been reviewed, patient examined, no change in status, stable for surgery.  I have reviewed the patient's chart and labs.  Questions were answered to the patient's satisfaction.     Gareth Fitzner Lenna Sciara

## 2015-01-16 NOTE — H&P (Signed)
Chloe Leonard is an 59 y.o. female.   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  This was 02/21/2014. At the time of surgery, there were significant Inflammatory changes in the pelvis thus no anastomosis was done.  She has completely healed and completely recovered.  She now presents for laparoscopic assisted colostomy closure.  Past Medical History  Diagnosis Date  . Diverticulitis   . Osteoarthritis   . Complication of anesthesia     "extreme pain after carpal tunnel release"    Past Surgical History  Procedure Laterality Date  . Carpal tunnel release Bilateral 2001  . Pelvic laparoscopy  04/04/2002  . Knee arthroscopy Left 07/2003  . Lipoma excision Right years ago  . Laparoscopic partial colectomy N/A 02/21/2014    Procedure: LAPAROSCOPIC ASSISTED SIGMOID COLECTOMY WITH MOBILIZATION OF SPLENIC FLEXION, DESCENDING COLOSTOMY, RIGID PROCTOSCOPE;  Surgeon: Odis Hollingshead, MD;  Location: [facility];  Service: General;  Laterality: N/A;    Family History  Problem Relation Age of Onset  . Cancer Father     melanoma  . Cancer Mother    Social History:  reports that she has never smoked. She has never used smokeless tobacco. She reports that she does not drink alcohol or use illicit drugs.  Allergies:  Allergies  Allergen Reactions  . Augmentin [Amoxicillin-Pot Clavulanate] Nausea And Vomiting  . Cortisone Other (See Comments)    Lips swell and face flushes     Medications Prior to Admission  Medication Sig Dispense Refill  . cholecalciferol (VITAMIN D) 1000 UNITS tablet Take 1,000 Units by mouth daily.    . fexofenadine (ALLEGRA) 180 MG tablet Take 180 mg by mouth daily.     . fluticasone (FLONASE) 50 MCG/ACT nasal spray Place 1 spray into both nostrils daily as needed for allergies or rhinitis.     Marland Kitchen metroNIDAZOLE (FLAGYL) 500 MG tablet Take 500  mg by mouth as directed. Prior to surgery    . Multiple Vitamin (MULTIVITAMIN WITH MINERALS) TABS tablet Take 1 tablet by mouth daily.    Marland Kitchen neomycin (MYCIFRADIN) 500 MG tablet TAKE TWO TABLETS AT 2PM, 3 PM, AND 10 PM, THE DAY PRIOR TO SURGERY  0  . Omega-3 Fatty Acids (FISH OIL) 1200 MG CAPS Take 1 capsule by mouth daily. Has not had in last 2-3 weeks    . OVER THE COUNTER MEDICATION [employer] Brand Calcium Citrate +D with Magnesium  Patient takes 2 tablets daily      No results found for this or any previous visit (from the past 48 hour(s)). No results found.  Review of Systems  Constitutional: Negative for fever and chills.  Gastrointestinal: Negative for nausea and vomiting.    Blood pressure 123/69, pulse 70, temperature 97.5 F (36.4 C), temperature source Oral, resp. rate 18, height 5\' 5"  (1.651 m), weight 77.565 kg (171 lb), SpO2 100 %. Physical Exam  Constitutional: She appears well-developed and well-nourished. No distress.  HENT:  Head: Normocephalic and atraumatic.  Cardiovascular: Normal rate and regular rhythm.   Respiratory: Effort normal and breath sounds normal.  GI: Soft.  Left lower quadrant colostomy. Midline scar.  Musculoskeletal: She exhibits no edema.  Neurological: She is alert.  Skin: Skin is warm and dry.  Psychiatric: She has a normal mood and affect. Her behavior is normal.     Assessment/Plan Descending colostomy status.  Plan laparoscopic assisted colostomy closure. She  understands that 2 out of 3 times we are able to do this. Procedure and risks have been discussed with her preoperatively.  Sigmund Morera J 01/16/2015, 7:12 AM

## 2015-01-17 LAB — CBC
HCT: 34.7 % — ABNORMAL LOW (ref 36.0–46.0)
Hemoglobin: 11.4 g/dL — ABNORMAL LOW (ref 12.0–15.0)
MCH: 29.3 pg (ref 26.0–34.0)
MCHC: 32.9 g/dL (ref 30.0–36.0)
MCV: 89.2 fL (ref 78.0–100.0)
Platelets: 269 10*3/uL (ref 150–400)
RBC: 3.89 MIL/uL (ref 3.87–5.11)
RDW: 13.5 % (ref 11.5–15.5)
WBC: 12.3 10*3/uL — ABNORMAL HIGH (ref 4.0–10.5)

## 2015-01-17 LAB — BASIC METABOLIC PANEL
Anion gap: 7 (ref 5–15)
BUN: 8 mg/dL (ref 6–20)
CO2: 29 mmol/L (ref 22–32)
Calcium: 8.8 mg/dL — ABNORMAL LOW (ref 8.9–10.3)
Chloride: 103 mmol/L (ref 101–111)
Creatinine, Ser: 0.61 mg/dL (ref 0.44–1.00)
GFR calc Af Amer: >60 mL/min (ref >60)
GFR calc non Af Amer: >60 mL/min (ref >60)
Glucose, Bld: 132 mg/dL — ABNORMAL HIGH (ref 65–99)
Potassium: 4.5 mmol/L (ref 3.5–5.1)
Sodium: 139 mmol/L (ref 135–145)

## 2015-01-17 NOTE — Progress Notes (Signed)
Patient ID: Chloe Leonard, female   DOB: 19-Apr-1956, 59 y.o.   MRN: [id number] [facility] Surgery Progress Note:   1 Day Post-Op  Subjective: Mental status is clear.  Rosy cheeks.  No itching.  Objective: Vital signs in last 24 hours: Temp:  [97.6 F (36.4 C)-98.8 F (37.1 C)] 98.7 F (37.1 C) (07/02 0616) Pulse Rate:  [74-95] 77 (07/02 0616) Resp:  [10-19] 14 (07/02 0808) BP: (110-130)/(56-73) 115/61 mmHg (07/02 0616) SpO2:  [96 %-100 %] 100 % (07/02 0808) FiO2 (%):  [46 %-48 %] 46 % (07/02 0254)  Intake/Output from previous day: 07/01 0701 - 07/02 0700 In: 4000 [I.V.:4000] Out: 2430 [Urine:2010; Drains:320; Blood:100] Intake/Output this shift:    Physical Exam: Work of breathing is normal.  Has nasal O2.  No flatus yet.    Lab Results:  Results for orders placed or performed during the hospital encounter of 01/16/15 (from the past 48 hour(s))  Basic metabolic panel     Status: Abnormal   Collection Time: 01/17/15  4:55 AM  Result Value Ref Range   Sodium 139 135 - 145 mmol/L   Potassium 4.5 3.5 - 5.1 mmol/L   Chloride 103 101 - 111 mmol/L   CO2 29 22 - 32 mmol/L   Glucose, Bld 132 (H) 65 - 99 mg/dL   BUN 8 6 - 20 mg/dL   Creatinine, Ser 0.61 0.44 - 1.00 mg/dL   Calcium 8.8 (L) 8.9 - 10.3 mg/dL   GFR calc non Af Amer >60 >60 mL/min   GFR calc Af Amer >60 >60 mL/min    Comment: (NOTE) The eGFR has been calculated using the CKD EPI equation. This calculation has not been validated in all clinical situations. eGFR's persistently <60 mL/min signify possible Chronic Kidney Disease.    Anion gap 7 5 - 15  CBC     Status: Abnormal   Collection Time: 01/17/15  4:55 AM  Result Value Ref Range   WBC 12.3 (H) 4.0 - 10.5 K/uL    Comment: WHITE COUNT CONFIRMED ON SMEAR   RBC 3.89 3.87 - 5.11 MIL/uL   Hemoglobin 11.4 (L) 12.0 - 15.0 g/dL   HCT 34.7 (L) 36.0 - 46.0 %   MCV 89.2 78.0 - 100.0 fL   MCH 29.3 26.0 - 34.0 pg   MCHC 32.9 30.0 - 36.0 g/dL   RDW 13.5 11.5 -  15.5 %   Platelets 269 150 - 400 K/uL    Radiology/Results: No results found.  Anti-infectives: Anti-infectives    Start     Dose/Rate Route Frequency Ordered Stop   01/16/15 2000  cefoTEtan (CEFOTAN) 2 g in dextrose 5 % 50 mL IVPB     2 g 100 mL/hr over 30 Minutes Intravenous Every 12 hours 01/16/15 1359 01/16/15 2204   01/16/15 0614  cefoTEtan in Dextrose 5% (CEFOTAN) IVPB 2 g     2 g Intravenous 30 min pre-op 01/16/15 8101 01/16/15 0755      Assessment/Plan: Problem List: Patient Active Problem List   Diagnosis Date Noted  . Colostomy in place 01/16/2015  . Diverticulitis large intestine 12/16/2013  . Diverticulitis of colon with < 3 cm abscess s/p lap assisted sigmoid colectomy and colostomy 02/21/14 11/29/2013  . 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  Not ready to advance diet 1 Day Post-Op    LOS: 1 day   Matt B. Hassell Done, MD, [facility] [phone] beeper [phone]  01/17/2015 9:41 AM

## 2015-01-17 NOTE — Progress Notes (Signed)
Utilization review completed.  

## 2015-01-17 NOTE — Plan of Care (Signed)
Problem: Phase II Progression Outcomes Goal: Progressing with IS, TCDB Outcome: Progressing Required cues for correct use, but then indep

## 2015-01-18 NOTE — Progress Notes (Signed)
Patient ID: Chloe Leonard, female   DOB: Dec 01, 1955, 59 y.o.   MRN: [id number] [facility] Surgery Progress Note:   2 Days Post-Op  Subjective: Mental status is clear.  Has been passing flatus.  Objective: Vital signs in last 24 hours: Temp:  [98.1 F (36.7 C)-99.6 F (37.6 C)] 98.1 F (36.7 C) (07/03 0630) Pulse Rate:  [75-84] 75 (07/03 0630) Resp:  [11-17] 11 (07/03 0900) BP: (114-118)/(61-71) 118/71 mmHg (07/03 0630) SpO2:  [100 %] 100 % (07/03 0900) FiO2 (%):  [46 %] 46 % (07/03 0000)  Intake/Output from previous day: 07/02 0701 - 07/03 0700 In: 2980 [P.O.:80; I.V.:2900] Out: 7102 [Urine:6950; Drains:150; Stool:2] Intake/Output this shift: Total I/O In: 489.6 [I.V.:489.6] Out: 350 [Urine:350]  Physical Exam: Work of breathing is normal.  Abdominal wound dressings in place.    Lab Results:  Results for orders placed or performed during the hospital encounter of 01/16/15 (from the past 48 hour(s))  Basic metabolic panel     Status: Abnormal   Collection Time: 01/17/15  4:55 AM  Result Value Ref Range   Sodium 139 135 - 145 mmol/L   Potassium 4.5 3.5 - 5.1 mmol/L   Chloride 103 101 - 111 mmol/L   CO2 29 22 - 32 mmol/L   Glucose, Bld 132 (H) 65 - 99 mg/dL   BUN 8 6 - 20 mg/dL   Creatinine, Ser 0.61 0.44 - 1.00 mg/dL   Calcium 8.8 (L) 8.9 - 10.3 mg/dL   GFR calc non Af Amer >60 >60 mL/min   GFR calc Af Amer >60 >60 mL/min    Comment: (NOTE) The eGFR has been calculated using the CKD EPI equation. This calculation has not been validated in all clinical situations. eGFR's persistently <60 mL/min signify possible Chronic Kidney Disease.    Anion gap 7 5 - 15  CBC     Status: Abnormal   Collection Time: 01/17/15  4:55 AM  Result Value Ref Range   WBC 12.3 (H) 4.0 - 10.5 K/uL    Comment: WHITE COUNT CONFIRMED ON SMEAR   RBC 3.89 3.87 - 5.11 MIL/uL   Hemoglobin 11.4 (L) 12.0 - 15.0 g/dL   HCT 34.7 (L) 36.0 - 46.0 %   MCV 89.2 78.0 - 100.0 fL   MCH 29.3 26.0 -  34.0 pg   MCHC 32.9 30.0 - 36.0 g/dL   RDW 13.5 11.5 - 15.5 %   Platelets 269 150 - 400 K/uL    Radiology/Results: No results found.  Anti-infectives: Anti-infectives    Start     Dose/Rate Route Frequency Ordered Stop   01/16/15 2000  cefoTEtan (CEFOTAN) 2 g in dextrose 5 % 50 mL IVPB     2 g 100 mL/hr over 30 Minutes Intravenous Every 12 hours 01/16/15 1359 01/16/15 2204   01/16/15 0614  cefoTEtan in Dextrose 5% (CEFOTAN) IVPB 2 g     2 g Intravenous 30 min pre-op 01/16/15 5643 01/16/15 0755      Assessment/Plan: Problem List: Patient Active Problem List   Diagnosis Date Noted  . Colostomy in place 01/16/2015  . Diverticulitis large intestine 12/16/2013  . Diverticulitis of colon with < 3 cm abscess s/p lap assisted sigmoid colectomy and colostomy 02/21/14 11/29/2013  . 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 2 Days Post-Op    LOS: 2 days   Matt B. Hassell Done, MD, [facility] [phone] beeper [phone]  01/18/2015 10:26 AM

## 2015-01-19 MED ORDER — HYDROCODONE-ACETAMINOPHEN 5-325 MG PO TABS: 1.0000 | ORAL_TABLET | ORAL | Status: DC | PRN

## 2015-01-19 NOTE — Progress Notes (Signed)
Please clarify dsy change orders.Mid abd dsy has some old dried drainage,other wound areas are cdi.Pt states rounding physician stated that surgical wounds would be changed by surgeon in a few days. Thank you [name]

## 2015-01-19 NOTE — Progress Notes (Signed)
Patient ID: Chloe Leonard, female   DOB: 10-11-1955, 59 y.o.   MRN: [id number] [facility] Surgery Progress Note:   3 Days Post-Op  Subjective: Mental status is clear.  Not taking PCA.   Objective: Vital signs in last 24 hours: Temp:  [98 F (36.7 C)-99.2 F (37.3 C)] 99.2 F (37.3 C) (07/04 0500) Pulse Rate:  [66-89] 66 (07/04 0500) Resp:  [14-18] 14 (07/04 0800) BP: (113-150)/(67-77) 122/67 mmHg (07/04 0500) SpO2:  [96 %-99 %] 98 % (07/04 0800) FiO2 (%):  [96 %] 96 % (07/04 0500)  Intake/Output from previous day: 07/03 0701 - 07/04 0700 In: 2605 [P.O.:480; I.V.:2125] Out: 2525 [Urine:2450; Drains:75] Intake/Output this shift: Total I/O In: -  Out: 650 [Urine:650]  Physical Exam: Work of breathing is normal.  Packing removed from ostomy;  Looks pretty good-a little dry fat medially.  Nursing to repack.  JP serosanguinous-will remove.  Ready for dietary advancement.    Lab Results:  No results found for this or any previous visit (from the past 48 hour(s)).  Radiology/Results: No results found.  Anti-infectives: Anti-infectives    Start     Dose/Rate Route Frequency Ordered Stop   01/16/15 2000  cefoTEtan (CEFOTAN) 2 g in dextrose 5 % 50 mL IVPB     2 g 100 mL/hr over 30 Minutes Intravenous Every 12 hours 01/16/15 1359 01/16/15 2204   01/16/15 0614  cefoTEtan in Dextrose 5% (CEFOTAN) IVPB 2 g     2 g Intravenous 30 min pre-op 01/16/15 8469 01/16/15 0755      Assessment/Plan: Problem List: Patient Active Problem List   Diagnosis Date Noted  . Colostomy in place 01/16/2015  . Diverticulitis large intestine 12/16/2013  . Diverticulitis of colon with < 3 cm abscess s/p lap assisted sigmoid colectomy and colostomy 02/21/14 11/29/2013  . 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  Hopeful discharge tomorrow.   3 Days Post-Op    LOS: 3 days   Matt B. Hassell Done, MD, [facility] [phone]  beeper [phone]  01/19/2015 9:07 AM

## 2015-01-20 ENCOUNTER — Encounter (HOSPITAL_COMMUNITY): Payer: Self-pay | Admitting: General Surgery

## 2015-01-20 MED ORDER — HYDROCODONE-ACETAMINOPHEN 5-325 MG PO TABS: 1.0000 | ORAL_TABLET | ORAL | Status: DC | PRN

## 2015-01-20 NOTE — Progress Notes (Signed)
4 Days Post-Op  Subjective: Not having much pain.  Tolerating her diet.  BMs starting to get formed.  No pain with BM.  Objective: Vital signs in last 24 hours: Temp:  [97.9 F (36.6 C)-98.7 F (37.1 C)] 98.3 F (36.8 C) (07/05 0554) Pulse Rate:  [78-110] 82 (07/05 0554) Resp:  [14-18] 16 (07/05 0554) BP: (101-116)/(65-78) 101/65 mmHg (07/05 0554) SpO2:  [98 %-100 %] 98 % (07/05 0554) Last BM Date: 01/19/15  Intake/Output from previous day: 07/04 0701 - 07/05 0700 In: 2235 [P.O.:360; I.V.:1875] Out: 1620 [Urine:1600; Drains:20] Intake/Output this shift: Total I/O In: -  Out: 450 [Urine:450]  PE: General- In NAD Abdomen-soft, trocar site incisions are clean and intact, stoma site wound is clean  Lab Results:  No results for input(s): WBC, HGB, HCT, PLT in the last 72 hours. BMET No results for input(s): NA, K, CL, CO2, GLUCOSE, BUN, CREATININE, CALCIUM in the last 72 hours. PT/INR No results for input(s): LABPROT, INR in the last 72 hours. Comprehensive Metabolic Panel:    Component Value Date/Time   NA 139 01/17/2015 0455   NA 140 01/13/2015 1000   K 4.5 01/17/2015 0455   K 4.7 01/13/2015 1000   CL 103 01/17/2015 0455   CL 102 01/13/2015 1000   CO2 29 01/17/2015 0455   CO2 29 01/13/2015 1000   BUN 8 01/17/2015 0455   BUN 15 01/13/2015 1000   CREATININE 0.61 01/17/2015 0455   CREATININE 0.67 01/13/2015 1000   GLUCOSE 132* 01/17/2015 0455   GLUCOSE 95 01/13/2015 1000   CALCIUM 8.8* 01/17/2015 0455   CALCIUM 9.3 01/13/2015 1000   AST 24 01/13/2015 1000   AST 19 02/18/2014 0910   ALT 18 01/13/2015 1000   ALT 15 02/18/2014 0910   ALKPHOS 79 01/13/2015 1000   ALKPHOS 75 02/18/2014 0910   BILITOT 0.4 01/13/2015 1000   BILITOT 0.2* 02/18/2014 0910   PROT 6.9 01/13/2015 1000   PROT 7.2 02/18/2014 0910   ALBUMIN 3.8 01/13/2015 1000   ALBUMIN 3.4* 02/18/2014 0910     Studies/Results: No results found.  Anti-infectives: Anti-infectives    Start      Dose/Rate Route Frequency Ordered Stop   01/16/15 2000  cefoTEtan (CEFOTAN) 2 g in dextrose 5 % 50 mL IVPB     2 g 100 mL/hr over 30 Minutes Intravenous Every 12 hours 01/16/15 1359 01/16/15 2204   01/16/15 0614  cefoTEtan in Dextrose 5% (CEFOTAN) IVPB 2 g     2 g Intravenous 30 min pre-op 01/16/15 5638 01/16/15 0755      Assessment Active Problems:  s/p laparoscopic colostomy closure-progressing well    LOS: 4 days   Plan: Discharge.  Home health care to assist with open wound.  Follow up in office in 2 weeks.  Discharge instructions given to her.   Isiac Breighner J 01/20/2015

## 2015-01-20 NOTE — Progress Notes (Signed)
[facility] is providing the following services: HHRN  If patient discharges after hours, please call [phone].   Linward Headland 01/20/2015, 2:02 PM

## 2015-01-20 NOTE — Care Management Note (Signed)
Case Management Note  Patient Details  Name: Chloe Leonard MRN: [id number] Date of Birth: Jul 25, 1955  Subjective/Objective:                   Procedure(s) Performed: Procedure(s) (LRB): LAPAROSCOPIC LYSIS OF ADHESIONS COLOSTOMY CLOSURE MOBILIZATION OF SPLENIC FLEXURE PROCTOSCOPY (N/A) PROCTOSCOPY LAPAROSCOPIC LYSIS OF ADHESIONS Action/Plan:  Discharge planning Expected Discharge Date:                  Expected Discharge Plan:  [place]  In-House Referral:     Discharge planning Services  CM Consult  Post Acute Care Choice:    Choice offered to:  Patient  DME Arranged:    DME Agency:     HH Arranged:  RN [place] Agency:  [place]  Status of Service:  Completed, signed off  Medicare Important Message Given:    Date Medicare IM Given:    Medicare IM give by:    Date Additional Medicare IM Given:    Additional Medicare Important Message give by:     If discussed at [place] of Stay Meetings, dates discussed:    Additional Comments: CM spoke with pt who chooses [facility] to render [facility].  Address and contact information verified by pt.  Referral called to [facility] rep, Lecretia.  No other CM needs were communicated. Dellie Catholic, RN 01/20/2015, 12:27 PM

## 2015-01-21 NOTE — Discharge Summary (Signed)
Physician Discharge Summary  Patient ID: LYNNIX SCHONEMAN MRN: [id number] DOB/AGE: 10-29-1955 59 y.o.  Admit date: 01/16/2015 Discharge date: 01/20/2015  Admission Diagnoses:  Colostomy in place  Discharge Diagnoses:  Status post laparoscopic colostomy closure 01/16/15  Discharged Condition: good  Hospital Course: She underwent the above procedure and progressed well postop.  Previous colostomy site wound was left to heal by secondary intention.  She was able to be discharged on her fourth postop day.  Discharge instructions were given to her.  HHRN will assist with daily dressing changes.  Discharge Exam: Blood pressure 101/65, pulse 82, temperature 98.3 F (36.8 C), temperature source Oral, resp. rate 16, height 5\' 5"  (1.651 m), weight 77.565 kg (171 lb), SpO2 98 %.   Disposition: 01-Home or Self Care     Medication List    TAKE these medications        cholecalciferol 1000 UNITS tablet  Commonly known as:  VITAMIN D  Take 1,000 Units by mouth daily.     fexofenadine 180 MG tablet  Commonly known as:  ALLEGRA  Take 180 mg by mouth daily.     Fish Oil 1200 MG Caps  Take 1 capsule by mouth daily. Has not had in last 2-3 weeks     fluticasone 50 MCG/ACT nasal spray  Commonly known as:  FLONASE  Place 1 spray into both nostrils daily as needed for allergies or rhinitis.     HYDROcodone-acetaminophen 5-325 MG per tablet  Commonly known as:  NORCO/VICODIN  Take 1-2 tablets by mouth every 4 (four) hours as needed for moderate pain.     metroNIDAZOLE 500 MG tablet  Commonly known as:  FLAGYL  Take 500 mg by mouth as directed. Prior to surgery     multivitamin with minerals Tabs tablet  Take 1 tablet by mouth daily.     neomycin 500 MG tablet  Commonly known as:  MYCIFRADIN  TAKE TWO TABLETS AT 2PM, 3 PM, AND 10 PM, THE DAY PRIOR TO SURGERY     OVER THE COUNTER MEDICATION  [employer] Brand Calcium Citrate +D with Magnesium  Patient takes 2 tablets daily          Signed: Marylan Glore J 01/21/2015, 9:36 AM

## 2015-03-24 ENCOUNTER — Encounter: Payer: Self-pay | Admitting: General Surgery

## 2015-03-24 ENCOUNTER — Other Ambulatory Visit: Payer: Self-pay

## 2015-03-24 ENCOUNTER — Other Ambulatory Visit: Payer: Self-pay | Admitting: General Surgery

## 2015-03-24 ENCOUNTER — Ambulatory Visit
Admission: RE | Admit: 2015-03-24 | Discharge: 2015-03-24 | Disposition: A | Discharge status: Home / Self Care | Payer: BLUE CROSS/BLUE SHIELD | Source: Ambulatory Visit | Attending: General Surgery | Admitting: General Surgery

## 2015-03-24 DIAGNOSIS — R1032 Left lower quadrant pain: Secondary | ICD-10-CM

## 2015-03-24 MED ORDER — IOPAMIDOL (ISOVUE-300) INJECTION 61%
100.0000 mL | Freq: Once | INTRAVENOUS | Status: AC | PRN
  Administered 2015-03-24: 100 mL via INTRAVENOUS

## 2015-03-24 NOTE — Progress Notes (Unsigned)
I spoke with her.  Yesterday, she had the abrupt onset of "left hip" pain followed by multiple episodes of diarrhea and then feculent drainage from her vagina.  No fever or chills.  Her BMs subsequently became more solid.  She has since had some non-feculent vaginal discharge.  Will get a CT scan of the abdomen and pelvis then see her in the office for examination.

## 2015-03-24 NOTE — Addendum Note (Signed)
Addended by: Odis Hollingshead on: 03/24/2015 09:30 AM   Modules accepted: Orders

## 2015-03-27 ENCOUNTER — Other Ambulatory Visit: Payer: Self-pay | Admitting: General Surgery

## 2015-03-27 DIAGNOSIS — K529 Noninfective gastroenteritis and colitis, unspecified: Secondary | ICD-10-CM

## 2015-03-27 NOTE — Addendum Note (Signed)
Addended by: Odis Hollingshead on: 03/27/2015 11:11 AM   Modules accepted: Orders

## 2015-04-06 ENCOUNTER — Ambulatory Visit
Admission: RE | Admit: 2015-04-06 | Discharge: 2015-04-06 | Disposition: A | Discharge status: Home / Self Care | Payer: BLUE CROSS/BLUE SHIELD | Source: Ambulatory Visit | Attending: General Surgery | Admitting: General Surgery

## 2015-04-06 ENCOUNTER — Inpatient Hospital Stay: Admission: RE | Admit: 2015-04-06 | Payer: BLUE CROSS/BLUE SHIELD | Source: Ambulatory Visit

## 2015-04-06 DIAGNOSIS — K529 Noninfective gastroenteritis and colitis, unspecified: Secondary | ICD-10-CM

## 2015-04-06 MED ORDER — IOPAMIDOL (ISOVUE-300) INJECTION 61%
100.0000 mL | Freq: Once | INTRAVENOUS | Status: AC | PRN
  Administered 2015-04-06: 100 mL via INTRAVENOUS

## 2015-05-06 ENCOUNTER — Other Ambulatory Visit: Payer: Self-pay | Admitting: General Surgery

## 2015-05-06 DIAGNOSIS — Z933 Colostomy status: Secondary | ICD-10-CM

## 2015-05-06 DIAGNOSIS — K913 Postprocedural intestinal obstruction, unspecified as to partial versus complete: Secondary | ICD-10-CM

## 2015-05-07 ENCOUNTER — Ambulatory Visit (HOSPITAL_COMMUNITY)
Admission: RE | Admit: 2015-05-07 | Discharge: 2015-05-07 | Disposition: A | Discharge status: Home / Self Care | Payer: BLUE CROSS/BLUE SHIELD | Source: Ambulatory Visit | Attending: General Surgery | Admitting: General Surgery

## 2015-05-07 DIAGNOSIS — K913 Postprocedural intestinal obstruction, unspecified as to partial versus complete: Secondary | ICD-10-CM

## 2015-05-07 DIAGNOSIS — R932 Abnormal findings on diagnostic imaging of liver and biliary tract: Secondary | ICD-10-CM | POA: Insufficient documentation

## 2015-05-07 DIAGNOSIS — K529 Noninfective gastroenteritis and colitis, unspecified: Secondary | ICD-10-CM | POA: Diagnosis present

## 2015-05-07 DIAGNOSIS — K6389 Other specified diseases of intestine: Secondary | ICD-10-CM | POA: Insufficient documentation

## 2015-05-07 DIAGNOSIS — K5730 Diverticulosis of large intestine without perforation or abscess without bleeding: Secondary | ICD-10-CM | POA: Insufficient documentation

## 2015-05-07 DIAGNOSIS — Z933 Colostomy status: Secondary | ICD-10-CM

## 2015-05-07 MED ORDER — IOHEXOL 300 MG/ML  SOLN
100.0000 mL | Freq: Once | INTRAMUSCULAR | Status: AC | PRN
  Administered 2015-05-07: 100 mL via INTRAVENOUS

## 2015-05-08 ENCOUNTER — Other Ambulatory Visit: Payer: BLUE CROSS/BLUE SHIELD

## 2015-09-24 ENCOUNTER — Other Ambulatory Visit: Payer: Self-pay

## 2015-09-24 DIAGNOSIS — Z1231 Encounter for screening mammogram for malignant neoplasm of breast: Secondary | ICD-10-CM

## 2015-10-01 ENCOUNTER — Ambulatory Visit
Admission: RE | Admit: 2015-10-01 | Discharge: 2015-10-01 | Disposition: A | Discharge status: Home / Self Care | Payer: BLUE CROSS/BLUE SHIELD | Source: Ambulatory Visit

## 2015-10-01 DIAGNOSIS — Z1231 Encounter for screening mammogram for malignant neoplasm of breast: Secondary | ICD-10-CM

## 2016-01-20 DIAGNOSIS — R1013 Epigastric pain: Secondary | ICD-10-CM | POA: Diagnosis not present

## 2016-07-04 DIAGNOSIS — Z130 Encounter for screening for diseases of the blood and blood-forming organs and certain disorders involving the immune mechanism: Secondary | ICD-10-CM | POA: Diagnosis not present

## 2016-07-04 DIAGNOSIS — Z1389 Encounter for screening for other disorder: Secondary | ICD-10-CM | POA: Diagnosis not present

## 2016-07-04 DIAGNOSIS — Z01419 Encounter for gynecological examination (general) (routine) without abnormal findings: Secondary | ICD-10-CM | POA: Diagnosis not present

## 2016-07-04 DIAGNOSIS — Z124 Encounter for screening for malignant neoplasm of cervix: Secondary | ICD-10-CM | POA: Diagnosis not present

## 2016-08-12 ENCOUNTER — Other Ambulatory Visit (INDEPENDENT_AMBULATORY_CARE_PROVIDER_SITE_OTHER): Payer: Self-pay

## 2016-08-12 ENCOUNTER — Ambulatory Visit (INDEPENDENT_AMBULATORY_CARE_PROVIDER_SITE_OTHER): Payer: BLUE CROSS/BLUE SHIELD | Admitting: Orthopaedic Surgery

## 2016-08-12 ENCOUNTER — Ambulatory Visit (INDEPENDENT_AMBULATORY_CARE_PROVIDER_SITE_OTHER): Payer: Self-pay

## 2016-08-12 ENCOUNTER — Encounter (INDEPENDENT_AMBULATORY_CARE_PROVIDER_SITE_OTHER): Payer: Self-pay | Admitting: Orthopaedic Surgery

## 2016-08-12 VITALS — BP 123/71 | HR 83 | Ht 65.0 in | Wt 170.0 lb

## 2016-08-12 DIAGNOSIS — M25561 Pain in right knee: Secondary | ICD-10-CM | POA: Diagnosis not present

## 2016-08-12 NOTE — Progress Notes (Signed)
Office Visit Note   Patient: Chloe Leonard           Date of Birth: October 12, 1955           MRN: [id number] Visit Date: 08/12/2016              Requested by: Leighton Ruff, MD [place], [place] [id number] PCP: Gerrit Heck, MD   Assessment & Plan: Visit Diagnoses: Osteoarthritis right knee with some mechanical symptoms.  Plan: Spider brace, exercises. Chloe Leonard's had a prior colon resection for diverticulitis and might have an issue with NSAIDs. She also had prior reaction to cortisone with facial flushing . Check with primary care physician regarding NSAIDs. Follow-up in the next 4-6 weeks if still having a problem.  Follow-Up Instructions: No Follow-up on file.   Orders:  No orders of the defined types were placed in this encounter.  No orders of the defined types were placed in this encounter.     Procedures: No procedures performed   Clinical Data: No additional findings.   Subjective: No chief complaint on file.   Pt presents with Right knee pain that started 3 weeks ago with "popping" in her knee. This has happened 3x previous and she could massage and pain would go away.  Yesterday afternoon she had the pop again and pain did not go away. She states she "feels" something in the patella area.  Previous she has Right hip and R ankle pain but not chronic.  Chloe Leonard denies history of injury or trauma. She is not aware that her left knee has been swollen. Pain is mostly along the medial compartment. Denies any significant problem with the same knee prior to this incident over the last 3-4 weeks  Review of Systems   Objective: Vital Signs: There were no vitals taken for this visit.  Physical Exam  Ortho Exam right knee without evidence of effusion. Mostly medial joint pain diffusely. No lateral joint pain. Considerable patellar crepitation. Full extension and approximately 105 of flexion without instability. No popliteal pain or  fullness. No calf pain. No distal edema. Neurovascular exam intact. Straight leg raise negative. No pain with range of motion right hip.  Specialty Comments:  No specialty comments available.  Imaging: No results found.   PMFS History: Patient Active Problem List   Diagnosis Date Noted  . Colostomy in place [facility]) 01/16/2015  . Diverticulitis large intestine 12/16/2013  . Diverticulitis of colon with < 3 cm abscess s/p lap assisted sigmoid colectomy and colostomy 02/21/14 11/29/2013  . Colovaginal fistula 11/29/2013   Past Medical History:  Diagnosis Date  . Complication of anesthesia    "extreme pain after carpal tunnel release"  . Diverticulitis   . Osteoarthritis     Family History  Problem Relation Age of Onset  . Cancer Father     melanoma  . Cancer Mother     Past Surgical History:  Procedure Laterality Date  . CARPAL TUNNEL RELEASE Bilateral 2001  . COLOSTOMY TAKEDOWN N/A 01/16/2015   Procedure: LAPAROSCOPIC LYSIS OF ADHESIONS COLOSTOMY CLOSURE MOBILIZATION OF SPLENIC FLEXURE PROCTOSCOPY;  Surgeon: Jackolyn Confer, MD;  Location: [facility];  Service: General;  Laterality: N/A;  . KNEE ARTHROSCOPY Left 07/2003  . LAPAROSCOPIC LYSIS OF ADHESIONS  01/16/2015   Procedure: LAPAROSCOPIC LYSIS OF ADHESIONS;  Surgeon: Jackolyn Confer, MD;  Location: [facility];  Service: General;;  . LAPAROSCOPIC PARTIAL COLECTOMY N/A 02/21/2014   Procedure: LAPAROSCOPIC ASSISTED SIGMOID COLECTOMY WITH MOBILIZATION OF SPLENIC FLEXION,  DESCENDING COLOSTOMY, RIGID PROCTOSCOPE;  Surgeon: Odis Hollingshead, MD;  Location: [facility];  Service: General;  Laterality: N/A;  . LIPOMA EXCISION Right years ago  . PELVIC LAPAROSCOPY  04/04/2002  . PROCTOSCOPY  01/16/2015   Procedure: PROCTOSCOPY;  Surgeon: Jackolyn Confer, MD;  Location: [facility];  Service: General;;   Social History   Occupational History  . Not on file.   Social History Main Topics  . Smoking status: Never Smoker  . Smokeless tobacco: Never Used  .  Alcohol use No  . Drug use: No  . Sexual activity: No

## 2016-08-15 ENCOUNTER — Other Ambulatory Visit (INDEPENDENT_AMBULATORY_CARE_PROVIDER_SITE_OTHER): Payer: Self-pay

## 2016-08-15 ENCOUNTER — Telehealth (INDEPENDENT_AMBULATORY_CARE_PROVIDER_SITE_OTHER): Payer: Self-pay | Admitting: Orthopaedic Surgery

## 2016-08-15 MED ORDER — TRAMADOL HCL 50 MG PO TABS: 50.0000 mg | ORAL_TABLET | Freq: Three times a day (TID) | ORAL | 0 refills | Status: DC | PRN

## 2016-08-15 NOTE — Telephone Encounter (Signed)
Patient touched base with her PCP about what she can take for pain relief and they do not want her taking Aleve due to her history of colon issues. Patient would like to know if there is something else she can take besides tylenol. Please call patient back at her work number [phone]

## 2016-08-15 NOTE — Telephone Encounter (Signed)
Please advise 

## 2016-08-15 NOTE — Telephone Encounter (Signed)
done

## 2016-08-15 NOTE — Telephone Encounter (Signed)
Fax Rx for tramadol 50mg  #30 1-2 tabs po tid prn to [facility] [address]

## 2016-09-07 ENCOUNTER — Other Ambulatory Visit: Payer: Self-pay | Admitting: Obstetrics and Gynecology

## 2016-09-07 DIAGNOSIS — Z1231 Encounter for screening mammogram for malignant neoplasm of breast: Secondary | ICD-10-CM

## 2016-10-03 ENCOUNTER — Ambulatory Visit: Payer: BLUE CROSS/BLUE SHIELD

## 2016-10-03 ENCOUNTER — Other Ambulatory Visit: Payer: Self-pay | Admitting: Family Medicine

## 2016-10-03 DIAGNOSIS — M8580 Other specified disorders of bone density and structure, unspecified site: Secondary | ICD-10-CM

## 2016-10-24 ENCOUNTER — Other Ambulatory Visit: Payer: Self-pay | Admitting: Family Medicine

## 2016-10-24 DIAGNOSIS — R1013 Epigastric pain: Secondary | ICD-10-CM

## 2016-10-28 ENCOUNTER — Ambulatory Visit
Admission: RE | Admit: 2016-10-28 | Discharge: 2016-10-28 | Disposition: A | Discharge status: Home / Self Care | Payer: BLUE CROSS/BLUE SHIELD | Source: Ambulatory Visit | Attending: Family Medicine | Admitting: Family Medicine

## 2016-10-28 ENCOUNTER — Ambulatory Visit
Admission: RE | Admit: 2016-10-28 | Discharge: 2016-10-28 | Disposition: A | Discharge status: Home / Self Care | Payer: BLUE CROSS/BLUE SHIELD | Source: Ambulatory Visit | Attending: Obstetrics and Gynecology | Admitting: Obstetrics and Gynecology

## 2016-10-28 DIAGNOSIS — M8580 Other specified disorders of bone density and structure, unspecified site: Secondary | ICD-10-CM

## 2016-10-28 DIAGNOSIS — Z1231 Encounter for screening mammogram for malignant neoplasm of breast: Secondary | ICD-10-CM

## 2016-10-28 DIAGNOSIS — M8588 Other specified disorders of bone density and structure, other site: Secondary | ICD-10-CM | POA: Diagnosis not present

## 2016-10-28 DIAGNOSIS — Z780 Asymptomatic menopausal state: Secondary | ICD-10-CM | POA: Diagnosis not present

## 2016-11-01 ENCOUNTER — Ambulatory Visit
Admission: RE | Admit: 2016-11-01 | Discharge: 2016-11-01 | Disposition: A | Discharge status: Home / Self Care | Payer: BLUE CROSS/BLUE SHIELD | Source: Ambulatory Visit | Attending: Family Medicine | Admitting: Family Medicine

## 2016-11-01 DIAGNOSIS — D3122 Benign neoplasm of left retina: Secondary | ICD-10-CM | POA: Diagnosis not present

## 2016-11-01 DIAGNOSIS — R1013 Epigastric pain: Secondary | ICD-10-CM

## 2016-11-01 DIAGNOSIS — K8020 Calculus of gallbladder without cholecystitis without obstruction: Secondary | ICD-10-CM | POA: Diagnosis not present

## 2016-11-08 DIAGNOSIS — R768 Other specified abnormal immunological findings in serum: Secondary | ICD-10-CM | POA: Diagnosis not present

## 2016-11-11 DIAGNOSIS — R109 Unspecified abdominal pain: Secondary | ICD-10-CM | POA: Diagnosis not present

## 2016-11-11 DIAGNOSIS — E875 Hyperkalemia: Secondary | ICD-10-CM | POA: Diagnosis not present

## 2016-11-11 DIAGNOSIS — K819 Cholecystitis, unspecified: Secondary | ICD-10-CM | POA: Diagnosis not present

## 2016-12-14 ENCOUNTER — Ambulatory Visit: Payer: Self-pay | Admitting: General Surgery

## 2016-12-14 DIAGNOSIS — K8020 Calculus of gallbladder without cholecystitis without obstruction: Secondary | ICD-10-CM | POA: Diagnosis not present

## 2016-12-14 NOTE — H&P (Signed)
Chloe Leonard 12/14/2016 9:11 AM Location: [facility] Patient #: [phone] DOB: Jul 16, 1956 Married / Language: English / Race: White Female   History of Present Illness Odis Hollingshead MD; 12/14/2016 10:05 AM) The patient is a 61 year old female.  Note:She's been sent over by Dr. Jacelyn Grip for evaluation of symptomatic cholelithiasis. She's been having episodes of epigastric and right upper quadrant pain radiating to the back. Sometimes as follows a meal. She underwent an ultrasound which demonstrated multiple gallstones and sludge. Largest gallstone is 2.2 cm in size. No family history of gallbladder disease. She is noticed increased gas bloating and flatulence. The symptoms started a little less than a year ago.  She is well known to me with respect to her past medical history and previous abdominal operations.  Allergies Malachy Moan, RMA; 12/14/2016 9:12 AM) Augmentin *PENICILLINS*  Cortisone *CORTICOSTEROIDS*   Medication History Malachy Moan, RMA; 12/14/2016 9:12 AM) Nystatin (100000 UNIT/ML Suspension, 5cc Suspension Mouth/Throat four times daily, Taken starting 04/15/2015) Active. Zofran (4MG  Tablet, 1 (one) Tablet Oral three times daily, as needed, Taken starting 04/08/2015) Active. Vitamin D (1000UNIT Tablet, Oral) Active. Calcium-Magnesium-Vitamin D (300-20-200MG -MG-UNIT Tablet Chewable, Oral) Active. Allegra Allergy (180MG  Tablet, Oral) Active. Flonase (50MCG/ACT Suspension, Nasal) Active. Medications Reconciled  Vitals Malachy Moan RMA; 12/14/2016 9:13 AM) 12/14/2016 9:12 AM Weight: 173 lb Height: 65in Body Surface Area: 1.86 m Body Mass Index: 28.79 kg/m  Temp.: 97.23F  Pulse: 90 (Regular)  BP: 120/82 (Sitting, Left Arm, Standard)       Assessment & Plan Odis Hollingshead MD; 12/14/2016 10:04 AM) SYMPTOMATIC CHOLELITHIASIS (K80.20) Impression: He has sludge and multiple gallstones on ultrasound with the largest  gallstone measuring 2.2 cm., Bile duct diameter is 5.2 mm. No clinical signs of cholecystitis.  Plan: I recommended laparoscopic possible open cholecystectomy. We discussed the procedure, rational, and risks, and aftercare of cholecystectomy. Risks include but are not limited to bleeding, infection, wound problems, anesthesia, diarrhea, bile leak, injury to common bile duct/liver/intestine. She seems to understand and agrees with the plan.  Jackolyn Confer, M.D.

## 2017-01-13 NOTE — Patient Instructions (Signed)
Chloe Leonard  01/13/2017   Your procedure is scheduled on: 01-26-17  Report to [facility]  Entrance Take [place]  elevators to 3rd floor to  [place] at 530AM.    Call this number if you have problems the morning of surgery [phone]    Remember: ONLY 1 PERSON MAY GO WITH YOU TO SHORT STAY TO GET  READY MORNING OF YOUR SURGERY.  Do not eat food or drink liquids :After Midnight.     Take these medicines the morning of surgery with A SIP OF WATER: omeprazole(prilosec), allegra, nasal spray as needed                                You may not have any metal on your body including hair pins and              piercings  Do not wear jewelry, make-up, lotions, powders or perfumes, deodorant             Do not wear nail polish.  Do not shave  48 hours prior to surgery.             Do not bring valuables to the hospital. [place].  Contacts, dentures or bridgework may not be worn into surgery.      Patients discharged the day of surgery will not be allowed to drive home.  Name and phone number of your driver:  Special Instructions: N/A              Please read over the following fact sheets you were given: _____________________________________________________________________           [facility] - Preparing for Surgery Before surgery, you can play an important role.  Because skin is not sterile, your skin needs to be as free of germs as possible.  You can reduce the number of germs on your skin by washing with CHG (chlorahexidine gluconate) soap before surgery.  CHG is an antiseptic cleaner which kills germs and bonds with the skin to continue killing germs even after washing. Please DO NOT use if you have an allergy to CHG or antibacterial soaps.  If your skin becomes reddened/irritated stop using the CHG and inform your nurse when you arrive at Short Stay. Do not shave (including legs and  underarms) for at least 48 hours prior to the first CHG shower.  You may shave your face/neck. Please follow these instructions carefully:  1.  Shower with CHG Soap the night before surgery and the  morning of Surgery.  2.  If you choose to wash your hair, wash your hair first as usual with your  normal  shampoo.  3.  After you shampoo, rinse your hair and body thoroughly to remove the  shampoo.                           4.  Use CHG as you would any other liquid soap.  You can apply chg directly  to the skin and wash                       Gently with a scrungie or clean washcloth.  5.  Apply the CHG Soap to your body ONLY FROM THE NECK DOWN.   Do not use on face/ open                           Wound or open sores. Avoid contact with eyes, ears mouth and genitals (private parts).                       Wash face,  Genitals (private parts) with your normal soap.             6.  Wash thoroughly, paying special attention to the area where your surgery  will be performed.  7.  Thoroughly rinse your body with warm water from the neck down.  8.  DO NOT shower/wash with your normal soap after using and rinsing off  the CHG Soap.                9.  Pat yourself dry with a clean towel.            10.  Wear clean pajamas.            11.  Place clean sheets on your bed the night of your first shower and do not  sleep with pets. Day of Surgery : Do not apply any lotions/deodorants the morning of surgery.  Please wear clean clothes to the hospital/surgery center.  FAILURE TO FOLLOW THESE INSTRUCTIONS MAY RESULT IN THE CANCELLATION OF YOUR SURGERY PATIENT SIGNATURE_________________________________  NURSE SIGNATURE__________________________________  ________________________________________________________________________

## 2017-01-16 ENCOUNTER — Encounter (HOSPITAL_COMMUNITY): Payer: Self-pay

## 2017-01-16 ENCOUNTER — Encounter (HOSPITAL_COMMUNITY)
Admission: RE | Admit: 2017-01-16 | Discharge: 2017-01-16 | Disposition: A | Discharge status: Home / Self Care | Payer: BLUE CROSS/BLUE SHIELD | Source: Ambulatory Visit | Attending: General Surgery | Admitting: General Surgery

## 2017-01-16 DIAGNOSIS — Z01812 Encounter for preprocedural laboratory examination: Secondary | ICD-10-CM | POA: Diagnosis not present

## 2017-01-16 DIAGNOSIS — K8020 Calculus of gallbladder without cholecystitis without obstruction: Secondary | ICD-10-CM | POA: Insufficient documentation

## 2017-01-16 LAB — COMPREHENSIVE METABOLIC PANEL
ALT: 16 U/L (ref 14–54)
AST: 22 U/L (ref 15–41)
Albumin: 4.1 g/dL (ref 3.5–5.0)
Alkaline Phosphatase: 94 U/L (ref 38–126)
Anion gap: 7 (ref 5–15)
BUN: 9 mg/dL (ref 6–20)
CO2: 27 mmol/L (ref 22–32)
Calcium: 9.3 mg/dL (ref 8.9–10.3)
Chloride: 105 mmol/L (ref 101–111)
Creatinine, Ser: 0.66 mg/dL (ref 0.44–1.00)
GFR calc Af Amer: >60 mL/min (ref >60)
GFR calc non Af Amer: >60 mL/min (ref >60)
Glucose, Bld: 99 mg/dL (ref 65–99)
Potassium: 4.8 mmol/L (ref 3.5–5.1)
Sodium: 139 mmol/L (ref 135–145)
Total Bilirubin: 0.6 mg/dL (ref 0.3–1.2)
Total Protein: 7.6 g/dL (ref 6.5–8.1)

## 2017-01-16 LAB — CBC WITH DIFFERENTIAL/PLATELET
Basophils Absolute: 0 10*3/uL (ref 0.0–0.1)
Basophils Relative: 0 %
Eosinophils Absolute: 0.1 10*3/uL (ref 0.0–0.7)
Eosinophils Relative: 2 %
HCT: 41.1 % (ref 36.0–46.0)
Hemoglobin: 13.7 g/dL (ref 12.0–15.0)
Lymphocytes Relative: 35 %
Lymphs Abs: 2 10*3/uL (ref 0.7–4.0)
MCH: 29.3 pg (ref 26.0–34.0)
MCHC: 33.3 g/dL (ref 30.0–36.0)
MCV: 88 fL (ref 78.0–100.0)
Monocytes Absolute: 0.3 10*3/uL (ref 0.1–1.0)
Monocytes Relative: 6 %
Neutro Abs: 3.1 10*3/uL (ref 1.7–7.7)
Neutrophils Relative %: 57 %
Platelets: 275 10*3/uL (ref 150–400)
RBC: 4.67 MIL/uL (ref 3.87–5.11)
RDW: 13.3 % (ref 11.5–15.5)
WBC: 5.5 10*3/uL (ref 4.0–10.5)

## 2017-01-26 ENCOUNTER — Ambulatory Visit (HOSPITAL_COMMUNITY): Payer: BLUE CROSS/BLUE SHIELD | Admitting: Certified Registered Nurse Anesthetist

## 2017-01-26 ENCOUNTER — Encounter (HOSPITAL_COMMUNITY): Payer: Self-pay | Admitting: *Deleted

## 2017-01-26 ENCOUNTER — Ambulatory Visit (HOSPITAL_COMMUNITY)
Admission: RE | Admit: 2017-01-26 | Discharge: 2017-01-26 | Disposition: A | Discharge status: Home / Self Care | Payer: BLUE CROSS/BLUE SHIELD | Source: Ambulatory Visit | Attending: General Surgery | Admitting: General Surgery

## 2017-01-26 ENCOUNTER — Encounter (HOSPITAL_COMMUNITY)
Admission: RE | Disposition: A | Discharge status: Home / Self Care | Payer: Self-pay | Source: Ambulatory Visit | Attending: General Surgery

## 2017-01-26 ENCOUNTER — Ambulatory Visit (HOSPITAL_COMMUNITY): Payer: BLUE CROSS/BLUE SHIELD

## 2017-01-26 DIAGNOSIS — Z419 Encounter for procedure for purposes other than remedying health state, unspecified: Secondary | ICD-10-CM

## 2017-01-26 DIAGNOSIS — K8010 Calculus of gallbladder with chronic cholecystitis without obstruction: Secondary | ICD-10-CM | POA: Diagnosis not present

## 2017-01-26 DIAGNOSIS — Z888 Allergy status to other drugs, medicaments and biological substances status: Secondary | ICD-10-CM | POA: Diagnosis not present

## 2017-01-26 DIAGNOSIS — Z880 Allergy status to penicillin: Secondary | ICD-10-CM | POA: Insufficient documentation

## 2017-01-26 DIAGNOSIS — K5720 Diverticulitis of large intestine with perforation and abscess without bleeding: Secondary | ICD-10-CM | POA: Diagnosis not present

## 2017-01-26 DIAGNOSIS — K8020 Calculus of gallbladder without cholecystitis without obstruction: Secondary | ICD-10-CM | POA: Diagnosis not present

## 2017-01-26 DIAGNOSIS — N823 Fistula of vagina to large intestine: Secondary | ICD-10-CM | POA: Diagnosis not present

## 2017-01-26 HISTORY — PX: CHOLECYSTECTOMY: SHX55

## 2017-01-26 SURGERY — LAPAROSCOPIC CHOLECYSTECTOMY WITH INTRAOPERATIVE CHOLANGIOGRAM
Anesthesia: General

## 2017-01-26 MED ORDER — CIPROFLOXACIN IN D5W 400 MG/200ML IV SOLN
400.0000 mg | INTRAVENOUS | Status: AC
  Administered 2017-01-26: 400 mg via INTRAVENOUS

## 2017-01-26 MED ORDER — 0.9 % SODIUM CHLORIDE (POUR BTL) OPTIME
TOPICAL | Status: DC | PRN
  Administered 2017-01-26: 1000 mL

## 2017-01-26 MED ORDER — OXYCODONE HCL 5 MG PO TABS: 5.0000 mg | ORAL_TABLET | ORAL | 0 refills | Status: DC | PRN

## 2017-01-26 MED ORDER — IOPAMIDOL (ISOVUE-300) INJECTION 61%
INTRAVENOUS | Status: AC
  Filled 2017-01-26: qty 50

## 2017-01-26 MED ORDER — HYDROMORPHONE HCL-NACL 0.5-0.9 MG/ML-% IV SOSY
PREFILLED_SYRINGE | INTRAVENOUS | Status: AC
  Administered 2017-01-26: 0.25 mg via INTRAVENOUS
  Filled 2017-01-26: qty 2

## 2017-01-26 MED ORDER — FENTANYL CITRATE (PF) 250 MCG/5ML IJ SOLN
INTRAMUSCULAR | Status: AC
  Filled 2017-01-26: qty 5

## 2017-01-26 MED ORDER — CIPROFLOXACIN IN D5W 400 MG/200ML IV SOLN
INTRAVENOUS | Status: AC
  Filled 2017-01-26: qty 200

## 2017-01-26 MED ORDER — SUGAMMADEX SODIUM 200 MG/2ML IV SOLN
INTRAVENOUS | Status: DC | PRN
  Administered 2017-01-26: 155 mg via INTRAVENOUS

## 2017-01-26 MED ORDER — BUPIVACAINE HCL (PF) 0.5 % IJ SOLN
INTRAMUSCULAR | Status: AC
  Filled 2017-01-26: qty 30

## 2017-01-26 MED ORDER — HYDROMORPHONE HCL-NACL 0.5-0.9 MG/ML-% IV SOSY
PREFILLED_SYRINGE | INTRAVENOUS | Status: AC
  Filled 2017-01-26: qty 1

## 2017-01-26 MED ORDER — ONDANSETRON HCL 4 MG PO TABS: 4.0000 mg | ORAL_TABLET | ORAL | 0 refills | Status: DC | PRN

## 2017-01-26 MED ORDER — CHLORHEXIDINE GLUCONATE CLOTH 2 % EX PADS: 6.0000 | MEDICATED_PAD | Freq: Once | CUTANEOUS | Status: DC

## 2017-01-26 MED ORDER — LIDOCAINE 2% (20 MG/ML) 5 ML SYRINGE
INTRAMUSCULAR | Status: DC | PRN
  Administered 2017-01-26: 60 mg via INTRAVENOUS

## 2017-01-26 MED ORDER — ONDANSETRON HCL 4 MG/2ML IJ SOLN
INTRAMUSCULAR | Status: AC
  Filled 2017-01-26: qty 2

## 2017-01-26 MED ORDER — LACTATED RINGERS IV SOLN
INTRAVENOUS | Status: DC | PRN
  Administered 2017-01-26 (×2): via INTRAVENOUS

## 2017-01-26 MED ORDER — PHENYLEPHRINE 40 MCG/ML (10ML) SYRINGE FOR IV PUSH (FOR BLOOD PRESSURE SUPPORT)
PREFILLED_SYRINGE | INTRAVENOUS | Status: AC
  Filled 2017-01-26: qty 10

## 2017-01-26 MED ORDER — MEPERIDINE HCL 50 MG/ML IJ SOLN: 6.2500 mg | INTRAMUSCULAR | Status: DC | PRN

## 2017-01-26 MED ORDER — ROCURONIUM BROMIDE 10 MG/ML (PF) SYRINGE
PREFILLED_SYRINGE | INTRAVENOUS | Status: DC | PRN
  Administered 2017-01-26: 50 mg via INTRAVENOUS

## 2017-01-26 MED ORDER — PROPOFOL 10 MG/ML IV BOLUS
INTRAVENOUS | Status: AC
  Filled 2017-01-26: qty 40

## 2017-01-26 MED ORDER — MIDAZOLAM HCL 2 MG/2ML IJ SOLN
INTRAMUSCULAR | Status: AC
  Filled 2017-01-26: qty 2

## 2017-01-26 MED ORDER — LIDOCAINE 2% (20 MG/ML) 5 ML SYRINGE
INTRAMUSCULAR | Status: AC
  Filled 2017-01-26: qty 5

## 2017-01-26 MED ORDER — OXYCODONE HCL 5 MG PO TABS: 5.0000 mg | ORAL_TABLET | Freq: Once | ORAL | Status: DC | PRN

## 2017-01-26 MED ORDER — EPHEDRINE 5 MG/ML INJ
INTRAVENOUS | Status: AC
  Filled 2017-01-26: qty 10

## 2017-01-26 MED ORDER — DEXAMETHASONE SODIUM PHOSPHATE 10 MG/ML IJ SOLN
INTRAMUSCULAR | Status: DC | PRN
  Administered 2017-01-26: 10 mg via INTRAVENOUS

## 2017-01-26 MED ORDER — SUCCINYLCHOLINE CHLORIDE 200 MG/10ML IV SOSY
PREFILLED_SYRINGE | INTRAVENOUS | Status: AC
  Filled 2017-01-26: qty 10

## 2017-01-26 MED ORDER — FENTANYL CITRATE (PF) 100 MCG/2ML IJ SOLN
INTRAMUSCULAR | Status: DC | PRN
  Administered 2017-01-26 (×5): 50 ug via INTRAVENOUS

## 2017-01-26 MED ORDER — DEXAMETHASONE SODIUM PHOSPHATE 10 MG/ML IJ SOLN
INTRAMUSCULAR | Status: AC
  Filled 2017-01-26: qty 1

## 2017-01-26 MED ORDER — LACTATED RINGERS IR SOLN
Status: DC | PRN
  Administered 2017-01-26: 1000 mL

## 2017-01-26 MED ORDER — MIDAZOLAM HCL 5 MG/5ML IJ SOLN
INTRAMUSCULAR | Status: DC | PRN
  Administered 2017-01-26: 2 mg via INTRAVENOUS

## 2017-01-26 MED ORDER — PROMETHAZINE HCL 25 MG/ML IJ SOLN: 6.2500 mg | INTRAMUSCULAR | Status: DC | PRN

## 2017-01-26 MED ORDER — PROPOFOL 10 MG/ML IV BOLUS
INTRAVENOUS | Status: DC | PRN
  Administered 2017-01-26: 140 mg via INTRAVENOUS

## 2017-01-26 MED ORDER — IOPAMIDOL (ISOVUE-300) INJECTION 61%
INTRAVENOUS | Status: DC | PRN
  Administered 2017-01-26: 5 mL

## 2017-01-26 MED ORDER — BUPIVACAINE HCL (PF) 0.5 % IJ SOLN
INTRAMUSCULAR | Status: DC | PRN
Start: 2017-01-26 — End: 2017-01-26
  Administered 2017-01-26: 11 mL

## 2017-01-26 MED ORDER — ONDANSETRON HCL 4 MG/2ML IJ SOLN
INTRAMUSCULAR | Status: DC | PRN
  Administered 2017-01-26: 4 mg via INTRAVENOUS

## 2017-01-26 MED ORDER — HYDROMORPHONE HCL-NACL 0.5-0.9 MG/ML-% IV SOSY
0.2500 mg | PREFILLED_SYRINGE | INTRAVENOUS | Status: DC | PRN
  Administered 2017-01-26: 0.5 mg via INTRAVENOUS
  Administered 2017-01-26 (×2): 0.25 mg via INTRAVENOUS
  Administered 2017-01-26: 0.5 mg via INTRAVENOUS

## 2017-01-26 MED ORDER — OXYCODONE HCL 5 MG/5ML PO SOLN: 5.0000 mg | Freq: Once | ORAL | Status: DC | PRN

## 2017-01-26 SURGICAL SUPPLY — 43 items
APL SKNCLS STERI-STRIP NONHPOA (GAUZE/BANDAGES/DRESSINGS) ×1
APPLIER CLIP 5 13 M/L LIGAMAX5 (MISCELLANEOUS) ×2
APR CLP MED LRG 5 ANG JAW (MISCELLANEOUS) ×1
BAG SPEC RTRVL 10 TROC 200 (ENDOMECHANICALS) ×1
BAG SPEC RTRVL LRG 6X4 10 (ENDOMECHANICALS)
BENZOIN TINCTURE PRP APPL 2/3 (GAUZE/BANDAGES/DRESSINGS) ×2 IMPLANT
CABLE HIGH FREQUENCY MONO STRZ (ELECTRODE) ×2 IMPLANT
CATH REDDICK CHOLANGI 4FR 50CM (CATHETERS) ×2 IMPLANT
CHLORAPREP W/TINT 26ML (MISCELLANEOUS) ×2 IMPLANT
CLIP APPLIE 5 13 M/L LIGAMAX5 (MISCELLANEOUS) ×1 IMPLANT
COVER MAYO STAND STRL (DRAPES) ×2 IMPLANT
DECANTER SPIKE VIAL GLASS SM (MISCELLANEOUS) ×2 IMPLANT
DEVICE TROCAR PUNCTURE CLOSURE (ENDOMECHANICALS) ×1 IMPLANT
DISSECTOR BLUNT TIP ENDO 5MM (MISCELLANEOUS) IMPLANT
DRAPE C-ARM 42X120 X-RAY (DRAPES) ×2 IMPLANT
DRSG TEGADERM 2-3/8X2-3/4 SM (GAUZE/BANDAGES/DRESSINGS) ×8 IMPLANT
ELECT REM PT RETURN 15FT ADLT (MISCELLANEOUS) ×2 IMPLANT
ENDOLOOP SUT PDS II  0 18 (SUTURE)
ENDOLOOP SUT PDS II 0 18 (SUTURE) IMPLANT
GAUZE SPONGE 2X2 8PLY STRL LF (GAUZE/BANDAGES/DRESSINGS) ×1 IMPLANT
GLOVE ECLIPSE 8.0 STRL XLNG CF (GLOVE) ×2 IMPLANT
GLOVE INDICATOR 8.0 STRL GRN (GLOVE) ×2 IMPLANT
GOWN STRL REUS W/TWL XL LVL3 (GOWN DISPOSABLE) ×6 IMPLANT
HEMOSTAT SNOW SURGICEL 2X4 (HEMOSTASIS) ×1 IMPLANT
IV CATH 14GX2 1/4 (CATHETERS) ×2 IMPLANT
KIT BASIN OR (CUSTOM PROCEDURE TRAY) ×2 IMPLANT
POUCH RETRIEVAL ECOSAC 10 (ENDOMECHANICALS) IMPLANT
POUCH RETRIEVAL ECOSAC 10MM (ENDOMECHANICALS) ×1
POUCH SPECIMEN RETRIEVAL 10MM (ENDOMECHANICALS) IMPLANT
SCISSORS LAP 5X35 DISP (ENDOMECHANICALS) ×2 IMPLANT
SET IRRIG TUBING LAPAROSCOPIC (IRRIGATION / IRRIGATOR) ×1 IMPLANT
SLEEVE XCEL OPT CAN 5 100 (ENDOMECHANICALS) ×4 IMPLANT
SPONGE GAUZE 2X2 STER 10/PKG (GAUZE/BANDAGES/DRESSINGS) ×1
STRIP CLOSURE SKIN 1/2X4 (GAUZE/BANDAGES/DRESSINGS) ×2 IMPLANT
SUT MNCRL AB 4-0 PS2 18 (SUTURE) ×2 IMPLANT
SUT VICRYL 0 UR6 27IN ABS (SUTURE) ×2 IMPLANT
TOWEL OR 17X26 10 PK STRL BLUE (TOWEL DISPOSABLE) ×2 IMPLANT
TOWEL OR NON WOVEN STRL DISP B (DISPOSABLE) IMPLANT
TRAY LAPAROSCOPIC (CUSTOM PROCEDURE TRAY) ×2 IMPLANT
TROCAR BLADELESS OPT 5 100 (ENDOMECHANICALS) ×2 IMPLANT
TROCAR XCEL BLUNT TIP 100MML (ENDOMECHANICALS) ×2 IMPLANT
TROCAR XCEL NON-BLD 11X100MML (ENDOMECHANICALS) IMPLANT
TUBING INSUF HEATED (TUBING) ×2 IMPLANT

## 2017-01-26 NOTE — Transfer of Care (Signed)
Immediate Anesthesia Transfer of Care Note  Patient: Chloe Leonard  Procedure(s) Performed: Procedure(s): LAPAROSCOPIC CHOLECYSTECTOMY WITH INTRAOPERATIVE CHOLANGIOGRAM (N/A)  Patient Location: PACU  Anesthesia Type:General  Level of Consciousness: awake, alert , oriented and patient cooperative  Airway & Oxygen Therapy: Patient Spontanous Breathing and Patient connected to face mask oxygen  Post-op Assessment: Report given to RN, Post -op Vital signs reviewed and stable and Patient moving all extremities  Post vital signs: Reviewed and stable  Last Vitals:  Vitals:   01/26/17 1000 01/26/17 1015  BP: (!) 141/81 132/76  Pulse: 66 64  Resp:  13  Temp:  (!) 36.4 C    Last Pain:  Vitals:   01/26/17 1015  TempSrc:   PainSc: 5       Patients Stated Pain Goal: 5 (62/69/48 [id number])  Complications: No apparent anesthesia complications

## 2017-01-26 NOTE — Discharge Instructions (Signed)
General Anesthesia, Adult, Care After °These instructions provide you with information about caring for yourself after your procedure. Your health care provider may also give you more specific instructions. Your treatment has been planned according to current medical practices, but problems sometimes occur. Call your health care provider if you have any problems or questions after your procedure. °What can I expect after the procedure? °After the procedure, it is common to have: °· Vomiting. °· A sore throat. °· Mental slowness. ° °It is common to feel: °· Nauseous. °· Cold or shivery. °· Sleepy. °· Tired. °· Sore or achy, even in parts of your body where you did not have surgery. ° °Follow these instructions at home: °For at least 24 hours after the procedure: °· Do not: °? Participate in activities where you could fall or become injured. °? Drive. °? Use heavy machinery. °? Drink alcohol. °? Take sleeping pills or medicines that cause drowsiness. °? Make important decisions or sign legal documents. °? Take care of children on your own. °· Rest. °Eating and drinking °· If you vomit, drink water, juice, or soup when you can drink without vomiting. °· Drink enough fluid to keep your urine clear or pale yellow. °· Make sure you have little or no nausea before eating solid foods. °· Follow the diet recommended by your health care provider. °General instructions °· Have a responsible adult stay with you until you are awake and alert. °· Return to your normal activities as told by your health care provider. Ask your health care provider what activities are safe for you. °· Take over-the-counter and prescription medicines only as told by your health care provider. °· If you smoke, do not smoke without supervision. °· Keep all follow-up visits as told by your health care provider. This is important. °Contact a health care provider if: °· You continue to have nausea or vomiting at home, and medicines are not helpful. °· You  cannot drink fluids or start eating again. °· You cannot urinate after 8-12 hours. °· You develop a skin rash. °· You have fever. °· You have increasing redness at the site of your procedure. °Get help right away if: °· You have difficulty breathing. °· You have chest pain. °· You have unexpected bleeding. °· You feel that you are having a life-threatening or urgent problem. °This information is not intended to replace advice given to you by your health care provider. Make sure you discuss any questions you have with your health care provider. °Document Released: 10/10/2000 Document Revised: 12/07/2015 Document Reviewed: 06/18/2015 °Elsevier Interactive Patient Education © 2018 Elsevier Inc[employer] °CCS ______[facility] °LAPAROSCOPIC CHOLECYSTECTOMY SURGERY: POST OP INSTRUCTIONS °Always review your discharge instruction sheet given to you by the facility where your surgery was performed. °IF YOU HAVE DISABILITY OR FAMILY LEAVE FORMS, YOU MUST BRING THEM TO THE OFFICE FOR PROCESSING.   °DO NOT GIVE THEM TO YOUR DOCTOR. ° °1. A prescription for pain medication may be given to you upon discharge.  Take your pain medication as prescribed, if needed.  If narcotic pain medicine is not needed, then you may take acetaminophen (Tylenol) or ibuprofen (Advil) as needed. °2. Take your usually prescribed medications unless otherwise directed. °3. If you need a refill on your pain medication, please contact your pharmacy.  They will contact our office to request authorization. Prescriptions will not be filled after 5pm or on week-ends. °4. You should follow a light diet the first few days after arrival home, such as   soup and crackers, etc.  Be sure to include lots of fluids daily.  May start lowfat, solid foods 2 days after the surgery. °5. Most patients will experience some swelling and bruising in the area of the incisions.  Ice packs will help.  Swelling and bruising can take several days to resolve.  °6. It is  common to experience some constipation if taking pain medication after surgery.  Increasing fluid intake and taking a stool softener (such as Colace) will usually help or prevent this problem from occurring.  A mild laxative (Milk of Magnesia or Miralax) should be taken according to package instructions if there are no bowel movements after 48 hours. °7. Unless discharge instructions indicate otherwise, you may remove your bandages 72 hours after surgery.  You may shower the day after surgery.  You may have steri-strips (small skin tapes) in place directly over the incision.  These strips should be left on the skin until they fall off.  If your surgeon used skin glue on the incision, you may shower in 24 hours.  The glue will flake off over the next 2-3 weeks.  Any sutures or staples will be removed at the office during your follow-up visit. °8. ACTIVITIES:  You may resume regular (light) daily activities beginning the next day--such as daily self-care, walking, climbing stairs--gradually increasing activities as tolerated.  You may have sexual intercourse when it is comfortable.  Refrain from any heavy lifting or straining for two weeks.  Do not lift anything over 10 pounds during that time.  °a. You may drive when you are no longer taking prescription pain medication, you can comfortably wear a seatbelt, and you can safely maneuver your car and apply brakes. °b. RETURN TO WORK:  Desk type work in 1 week, full duty work in 2 weeks if you are pain-free.________________________________________________________ °9. You should see your doctor in the office for a follow-up appointment approximately 2-3 weeks after your surgery.  Make sure that you call for this appointment within a day or two after you arrive home to insure a convenient appointment time. °10. OTHER INSTRUCTIONS: __________________________________________________________________________________________________________________________  __________________________________________________________________________________________________________________________ °WHEN TO CALL YOUR DOCTOR: °1. Fever over 101.0 °2. Inability to urinate °3. Continued bleeding from incision. °4. Increased pain, redness, or drainage from the incision. °5. Increasing abdominal pain ° °The clinic staff is available to answer your questions during regular business hours.  Please don’t hesitate to call and ask to speak to one of the nurses for clinical concerns.  If you have a medical emergency, go to the nearest emergency room or call 911.  A surgeon from [facility] is always on call at the hospital. °[address], [address], [place], [place]  [address] ? [address] [address], [place],    [address] °[phone] ? [phone] ? FAX [phone] °Web site: [email or web address] ° °

## 2017-01-26 NOTE — Op Note (Signed)
OPERATIVE NOTE-LAPAROSCOPIC CHOLECYSTECTOMY  Preoperative diagnosis:  Symptomatic cholelithiasis  Postoperative diagnosis:  Same  Procedure: Laparoscopic cholecystectomy with cholangiogram.  Surgeon: Jackolyn Confer, M.D.  Asst.:  Stark Klein M.D.  Indication for assistant:  She had multiple operations with adhesions present. Assistant needed for help with exposure and safety.  Anesthesia: General  EBL:  50-100 cc  Indication:   This is a 61 year old female with symptomatic cholelithiasis. She has a large gallstone in her gallbladder. She now presents for elective cholecystectomy.  Technique: She was brought to the operating room, placed supine on the operating table, and a general anesthetic was administered.  The abdominal wall was then sterilely prepped and draped.  A timeout was performed.    She was placed in slight reverse Trendelenburg position. A 5 mm incision was made in the right upper quadrant. Using a 5 mm laparoscope and Optiview trocar, access was gained into the peritoneal cavity and the pneumoperitoneum was created. The laparoscope was introduced into the trocar and no underlying bleeding or organ injury was noted. She was then placed in the reverse Trendelenburg position with the right side tilted slightly up.  Two 5 mm trocars were then placed into the abdominal cavity under laparoscopic vision. One in the epigastric area, and in the right upper quadrant area. An incision made in the subumbilical area and a 12 mm trocar placed here. The gallbladder was visualized and the fundus was grasped and retracted toward the right shoulder.  The infundibulum was mobilized with dissection close to the gallbladder and retracted laterally. The cystic duct was identified and a window was created around it. The anterior branch of cystic artery was also identified and a window was created around it. The critical view was achieved. A clip was placed at the neck of the gallbladder. A small  incision was made in the cystic duct. A cholangiocatheter was introduced through the anterior abdominal wall and placed in the cystic duct. A intraoperative cholangiogram was then performed.  Under real-time fluoroscopy, dilute contrast was injected into the cystic duct.  The common hepatic duct, the right and left hepatic ducts, and the common duct were all visualized. Contrast drained into the duodenum without obvious evidence of any obstructing ductal lesion. The final report is pending the Radiologist's interpretation.  The cholangiocatheter was removed, the cystic duct was clipped 3 times on the biliary side, and then the cystic duct was divided sharply. No bile leak was noted from the cystic duct stump.  The anterior branch of cystic artery was then clipped and divided. The posterior branch of the cystic artery was identified. It was isolated, clipped, and divided. Following this the gallbladder was dissected free from the liver using electrocautery. The gallbladder was then placed in a retrieval bag and removed from the abdominal cavity through the subumbilical incision.  The gallbladder fossa was inspected, irrigated, and bleeding was controlled with electrocautery. Inspection showed that hemostasis was adequate and there was no evidence of bile leak.  The irrigation fluid was evacuated as much as possible. Surgicel was placed in the gallbladder fossa.  The subumbilical trocar was removed and the fascial defect was closed with interrupted 0 Vicryl sutures. Repeat laparoscopy was performed in the abdominal cavity was inspected. There was no evidence of bleeding or organ injury.  The remaining trocars were removed and the pneumoperitoneum was released. The skin incisions were closed with 4-0 Monocryl subcuticular stitches. Steri-Strips and sterile dressings were applied.  The procedure was well-tolerated without any apparent complications. She was  taken to the recovery room in satisfactory  condition.

## 2017-01-26 NOTE — Anesthesia Procedure Notes (Signed)
Procedure Name: Intubation Date/Time: 01/26/2017 7:49 AM Performed by: Willeen Cass P Pre-anesthesia Checklist: Patient identified, Emergency Drugs available, Suction available and Patient being monitored Patient Re-evaluated:Patient Re-evaluated prior to induction Oxygen Delivery Method: Circle System Utilized Preoxygenation: Pre-oxygenation with 100% oxygen Induction Type: IV induction Ventilation: Mask ventilation without difficulty Laryngoscope Size: Mac and 3 Grade View: Grade I Tube type: Oral Tube size: 7.0 mm Number of attempts: 1 Airway Equipment and Method: Stylet and Oral airway Placement Confirmation: ETT inserted through vocal cords under direct vision,  positive ETCO2 and breath sounds checked- equal and bilateral Secured at: 21 cm Tube secured with: Tape Dental Injury: Teeth and Oropharynx as per pre-operative assessment

## 2017-01-26 NOTE — Anesthesia Preprocedure Evaluation (Signed)
Anesthesia Evaluation  Patient identified by MRN, date of birth, ID band Patient awake    Reviewed: Allergy & Precautions, NPO status , Patient's Chart, lab work & pertinent test results  Airway Mallampati: II  TM Distance: >3 FB Neck ROM: Full    Dental no notable dental hx.    Pulmonary neg pulmonary ROS,    Pulmonary exam normal breath sounds clear to auscultation       Cardiovascular negative cardio ROS Normal cardiovascular exam Rhythm:Regular Rate:Normal     Neuro/Psych negative neurological ROS  negative psych ROS   GI/Hepatic negative GI ROS, Neg liver ROS,   Endo/Other  negative endocrine ROS  Renal/GU negative Renal ROS  negative genitourinary   Musculoskeletal negative musculoskeletal ROS (+) Arthritis ,   Abdominal   Peds negative pediatric ROS (+)  Hematology negative hematology ROS (+)   Anesthesia Other Findings Cholelithiasis  Reproductive/Obstetrics negative OB ROS                             Anesthesia Physical  Anesthesia Plan  ASA: II  Anesthesia Plan: General   Post-op Pain Management:    Induction: Intravenous  PONV Risk Score and Plan: 4 or greater and Ondansetron, Dexamethasone, Propofol, Midazolam and Treatment may vary due to age or medical condition  Airway Management Planned: Oral ETT  Additional Equipment:   Intra-op Plan:   Post-operative Plan: Extubation in OR  Informed Consent: I have reviewed the patients History and Physical, chart, labs and discussed the procedure including the risks, benefits and alternatives for the proposed anesthesia with the patient or authorized representative who has indicated his/her understanding and acceptance.   Dental advisory given  Plan Discussed with: CRNA and Surgeon  Anesthesia Plan Comments:         Anesthesia Quick Evaluation

## 2017-01-26 NOTE — H&P (Signed)
H & P  Chloe Leonard is a 61 year old female who has been having episodes of epigastric and right upper quadrant pain radiating to the back. Sometimes as follows a meal. She underwent an ultrasound which demonstrated multiple gallstones and sludge. Largest gallstone is 2.2 cm in size. No family history of gallbladder disease. She is noticed increased gas bloating and flatulence. The symptoms started a little less than a year ago.  She is well known to me with respect to her past medical history and previous abdominal operations.  Allergies  Augmentin *PENICILLINS*  Cortisone *CORTICOSTEROIDS*   Prior to Admission medications   Medication Sig Start Date End Date Taking? Authorizing Provider  Calcium Carb-Cholecalciferol (CALCIUM 600+D3 PO) Take 1 tablet by mouth daily.   Yes [provider]  cholecalciferol (VITAMIN D) 1000 UNITS tablet Take 1,000 Units by mouth daily.   Yes [provider]  fexofenadine (ALLEGRA) 180 MG tablet Take 180 mg by mouth daily.    Yes [provider]  fluticasone (FLONASE) 50 MCG/ACT nasal spray Place 1 spray into both nostrils daily as needed for allergies or rhinitis.    Yes [provider]  Multiple Vitamin (MULTIVITAMIN WITH MINERALS) TABS tablet Take 1 tablet by mouth daily.   Yes [provider]  omeprazole (PRILOSEC) 20 MG capsule Take 20 mg by mouth daily.   Yes [provider]  pseudoephedrine (SUDAFED) 120 MG 12 hr tablet Take 120 mg by mouth daily as needed for congestion.   Yes [provider]    ROS: No fever, chills, n/v/d  GENERAL APPEARANCE:  Overweight female in NAD.  Pleasant and cooperative.  EARS, NOSE, MOUTH THROAT:  Barry/AT external ears:  no lesions or deformities external nose:  no lesions or deformities hearing:  grossly normal lips:  moist, no deformities EYES external: conjunctiva, lids, sclerae normal pupils:  equal, round   CV ascultation:  RRR, no  murmur  RESP auscultation:  breath sounds equal and clear respiratory effort:  normal  GASTROINTESTINAL abdomen:  Soft, non-tender, non-distended, no masses liver and spleen:  not enlarged. hernia:  none present scar:  Lower midline, LLQ, small right sided scars  SKIN rash or lesion: none subcutaneous nodules:  none  NEUROLOGIC speech:  normal  PSYCHIATRIC alertness and orientation:  normal mood/affect/behavior:  normal judgement and insight:  normal       Assessment & Plan  SYMPTOMATIC CHOLELITHIASIS (K80.20) Impression: He has sludge and multiple gallstones on ultrasound with the largest gallstone measuring 2.2 cm., Bile duct diameter is 5.2 mm. No clinical signs of cholecystitis.  Plan: I recommended laparoscopic possible open cholecystectomy. We discussed the procedure, rational, and risks, and aftercare of cholecystectomy. Risks include but are not limited to bleeding, infection, wound problems, anesthesia, diarrhea, bile leak, injury to common bile duct/liver/intestine. She seems to understand and agrees with the plan.  Jackolyn Confer, M.D.

## 2017-01-26 NOTE — Anesthesia Postprocedure Evaluation (Signed)
Anesthesia Post Note  Patient: Chloe Leonard  Procedure(s) Performed: Procedure(s) (LRB): LAPAROSCOPIC CHOLECYSTECTOMY WITH INTRAOPERATIVE CHOLANGIOGRAM (N/A)     Patient location during evaluation: PACU Anesthesia Type: General Level of consciousness: awake and alert Pain management: pain level controlled Vital Signs Assessment: post-procedure vital signs reviewed and stable Respiratory status: spontaneous breathing, nonlabored ventilation and respiratory function stable Cardiovascular status: blood pressure returned to baseline and stable Postop Assessment: no signs of nausea or vomiting Anesthetic complications: no    Last Vitals:  Vitals:   01/26/17 1000 01/26/17 1015  BP: (!) 141/81 132/76  Pulse: 66 64  Resp:  13  Temp:  (!) 36.4 C    Last Pain:  Vitals:   01/26/17 1015  TempSrc:   PainSc: [place]

## 2017-02-06 DIAGNOSIS — D3122 Benign neoplasm of left retina: Secondary | ICD-10-CM | POA: Diagnosis not present

## 2017-03-27 DIAGNOSIS — H5310 Unspecified subjective visual disturbances: Secondary | ICD-10-CM | POA: Diagnosis not present

## 2017-04-26 DIAGNOSIS — R300 Dysuria: Secondary | ICD-10-CM | POA: Diagnosis not present

## 2017-04-26 DIAGNOSIS — K6289 Other specified diseases of anus and rectum: Secondary | ICD-10-CM | POA: Diagnosis not present

## 2017-04-26 DIAGNOSIS — K5790 Diverticulosis of intestine, part unspecified, without perforation or abscess without bleeding: Secondary | ICD-10-CM | POA: Diagnosis not present

## 2017-05-03 DIAGNOSIS — N816 Rectocele: Secondary | ICD-10-CM | POA: Diagnosis not present

## 2017-05-22 DIAGNOSIS — L570 Actinic keratosis: Secondary | ICD-10-CM | POA: Diagnosis not present

## 2017-05-22 DIAGNOSIS — L918 Other hypertrophic disorders of the skin: Secondary | ICD-10-CM | POA: Diagnosis not present

## 2017-05-22 DIAGNOSIS — D485 Neoplasm of uncertain behavior of skin: Secondary | ICD-10-CM | POA: Diagnosis not present

## 2017-05-22 DIAGNOSIS — L821 Other seborrheic keratosis: Secondary | ICD-10-CM | POA: Diagnosis not present

## 2017-05-22 DIAGNOSIS — X32XXXD Exposure to sunlight, subsequent encounter: Secondary | ICD-10-CM | POA: Diagnosis not present

## 2017-05-22 DIAGNOSIS — D2271 Melanocytic nevi of right lower limb, including hip: Secondary | ICD-10-CM | POA: Diagnosis not present

## 2017-06-21 DIAGNOSIS — R3914 Feeling of incomplete bladder emptying: Secondary | ICD-10-CM | POA: Diagnosis not present

## 2017-06-21 DIAGNOSIS — R3915 Urgency of urination: Secondary | ICD-10-CM | POA: Diagnosis not present

## 2017-10-10 ENCOUNTER — Other Ambulatory Visit: Payer: Self-pay | Admitting: Obstetrics and Gynecology

## 2017-10-10 DIAGNOSIS — Z1231 Encounter for screening mammogram for malignant neoplasm of breast: Secondary | ICD-10-CM

## 2017-10-12 DIAGNOSIS — D3132 Benign neoplasm of left choroid: Secondary | ICD-10-CM | POA: Diagnosis not present

## 2017-10-12 DIAGNOSIS — H5203 Hypermetropia, bilateral: Secondary | ICD-10-CM | POA: Diagnosis not present

## 2017-10-12 DIAGNOSIS — H2513 Age-related nuclear cataract, bilateral: Secondary | ICD-10-CM | POA: Diagnosis not present

## 2017-10-31 ENCOUNTER — Ambulatory Visit
Admission: RE | Admit: 2017-10-31 | Discharge: 2017-10-31 | Disposition: A | Discharge status: Home / Self Care | Payer: BLUE CROSS/BLUE SHIELD | Source: Ambulatory Visit | Attending: Obstetrics and Gynecology | Admitting: Obstetrics and Gynecology

## 2017-10-31 DIAGNOSIS — Z1231 Encounter for screening mammogram for malignant neoplasm of breast: Secondary | ICD-10-CM

## 2018-01-02 DIAGNOSIS — Z0000 Encounter for general adult medical examination without abnormal findings: Secondary | ICD-10-CM | POA: Diagnosis not present

## 2018-01-02 DIAGNOSIS — R5383 Other fatigue: Secondary | ICD-10-CM | POA: Diagnosis not present

## 2018-01-02 DIAGNOSIS — E7800 Pure hypercholesterolemia, unspecified: Secondary | ICD-10-CM | POA: Diagnosis not present

## 2018-01-02 DIAGNOSIS — E559 Vitamin D deficiency, unspecified: Secondary | ICD-10-CM | POA: Diagnosis not present

## 2018-01-02 DIAGNOSIS — K219 Gastro-esophageal reflux disease without esophagitis: Secondary | ICD-10-CM | POA: Diagnosis not present

## 2018-05-17 DIAGNOSIS — L570 Actinic keratosis: Secondary | ICD-10-CM | POA: Diagnosis not present

## 2018-05-17 DIAGNOSIS — D229 Melanocytic nevi, unspecified: Secondary | ICD-10-CM | POA: Diagnosis not present

## 2018-05-17 DIAGNOSIS — B009 Herpesviral infection, unspecified: Secondary | ICD-10-CM | POA: Diagnosis not present

## 2018-08-22 DIAGNOSIS — K5790 Diverticulosis of intestine, part unspecified, without perforation or abscess without bleeding: Secondary | ICD-10-CM | POA: Diagnosis not present

## 2018-08-22 DIAGNOSIS — R1012 Left upper quadrant pain: Secondary | ICD-10-CM | POA: Diagnosis not present

## 2018-12-03 ENCOUNTER — Other Ambulatory Visit: Payer: Self-pay | Admitting: Obstetrics and Gynecology

## 2018-12-03 DIAGNOSIS — Z1231 Encounter for screening mammogram for malignant neoplasm of breast: Secondary | ICD-10-CM

## 2018-12-28 ENCOUNTER — Other Ambulatory Visit: Payer: Self-pay | Admitting: Family Medicine

## 2018-12-28 DIAGNOSIS — M8580 Other specified disorders of bone density and structure, unspecified site: Secondary | ICD-10-CM

## 2019-01-28 ENCOUNTER — Ambulatory Visit
Admission: RE | Admit: 2019-01-28 | Discharge: 2019-01-28 | Disposition: A | Discharge status: Home / Self Care | Payer: BC Managed Care – PPO | Source: Ambulatory Visit | Attending: Obstetrics and Gynecology | Admitting: Obstetrics and Gynecology

## 2019-01-28 ENCOUNTER — Other Ambulatory Visit: Payer: Self-pay | Admitting: Obstetrics and Gynecology

## 2019-01-28 DIAGNOSIS — N63 Unspecified lump in unspecified breast: Secondary | ICD-10-CM

## 2019-01-28 DIAGNOSIS — Z1231 Encounter for screening mammogram for malignant neoplasm of breast: Secondary | ICD-10-CM

## 2019-02-05 ENCOUNTER — Ambulatory Visit
Admission: RE | Admit: 2019-02-05 | Discharge: 2019-02-05 | Disposition: A | Discharge status: Home / Self Care | Payer: BC Managed Care – PPO | Source: Ambulatory Visit | Attending: Obstetrics and Gynecology | Admitting: Obstetrics and Gynecology

## 2019-02-05 ENCOUNTER — Other Ambulatory Visit: Payer: Self-pay

## 2019-02-05 DIAGNOSIS — R928 Other abnormal and inconclusive findings on diagnostic imaging of breast: Secondary | ICD-10-CM | POA: Diagnosis not present

## 2019-02-05 DIAGNOSIS — N63 Unspecified lump in unspecified breast: Secondary | ICD-10-CM

## 2019-02-05 DIAGNOSIS — N6489 Other specified disorders of breast: Secondary | ICD-10-CM | POA: Diagnosis not present

## 2019-02-13 DIAGNOSIS — Z124 Encounter for screening for malignant neoplasm of cervix: Secondary | ICD-10-CM | POA: Diagnosis not present

## 2019-02-13 DIAGNOSIS — Z130 Encounter for screening for diseases of the blood and blood-forming organs and certain disorders involving the immune mechanism: Secondary | ICD-10-CM | POA: Diagnosis not present

## 2019-02-13 DIAGNOSIS — Z01419 Encounter for gynecological examination (general) (routine) without abnormal findings: Secondary | ICD-10-CM | POA: Diagnosis not present

## 2019-02-13 DIAGNOSIS — Z1389 Encounter for screening for other disorder: Secondary | ICD-10-CM | POA: Diagnosis not present

## 2019-03-08 ENCOUNTER — Ambulatory Visit
Admission: RE | Admit: 2019-03-08 | Discharge: 2019-03-08 | Disposition: A | Discharge status: Home / Self Care | Payer: BC Managed Care – PPO | Source: Ambulatory Visit | Attending: Family Medicine | Admitting: Family Medicine

## 2019-03-08 ENCOUNTER — Other Ambulatory Visit: Payer: Self-pay

## 2019-03-08 DIAGNOSIS — Z780 Asymptomatic menopausal state: Secondary | ICD-10-CM | POA: Diagnosis not present

## 2019-03-08 DIAGNOSIS — M8580 Other specified disorders of bone density and structure, unspecified site: Secondary | ICD-10-CM

## 2019-03-08 DIAGNOSIS — M85851 Other specified disorders of bone density and structure, right thigh: Secondary | ICD-10-CM | POA: Diagnosis not present

## 2019-04-15 DIAGNOSIS — Z1329 Encounter for screening for other suspected endocrine disorder: Secondary | ICD-10-CM | POA: Diagnosis not present

## 2019-04-15 DIAGNOSIS — Z130 Encounter for screening for diseases of the blood and blood-forming organs and certain disorders involving the immune mechanism: Secondary | ICD-10-CM | POA: Diagnosis not present

## 2019-04-15 DIAGNOSIS — Z13228 Encounter for screening for other metabolic disorders: Secondary | ICD-10-CM | POA: Diagnosis not present

## 2019-04-15 DIAGNOSIS — M8580 Other specified disorders of bone density and structure, unspecified site: Secondary | ICD-10-CM | POA: Diagnosis not present

## 2019-04-15 DIAGNOSIS — Z0000 Encounter for general adult medical examination without abnormal findings: Secondary | ICD-10-CM | POA: Diagnosis not present

## 2019-04-15 DIAGNOSIS — Z13220 Encounter for screening for lipoid disorders: Secondary | ICD-10-CM | POA: Diagnosis not present

## 2019-04-15 DIAGNOSIS — J302 Other seasonal allergic rhinitis: Secondary | ICD-10-CM | POA: Diagnosis not present

## 2019-05-20 DIAGNOSIS — M47817 Spondylosis without myelopathy or radiculopathy, lumbosacral region: Secondary | ICD-10-CM | POA: Diagnosis not present

## 2019-05-20 DIAGNOSIS — M25552 Pain in left hip: Secondary | ICD-10-CM | POA: Diagnosis not present

## 2019-05-20 DIAGNOSIS — M1612 Unilateral primary osteoarthritis, left hip: Secondary | ICD-10-CM | POA: Diagnosis not present

## 2019-05-20 DIAGNOSIS — M545 Low back pain: Secondary | ICD-10-CM | POA: Diagnosis not present

## 2020-01-23 ENCOUNTER — Other Ambulatory Visit: Payer: Self-pay | Admitting: Obstetrics and Gynecology

## 2020-01-23 DIAGNOSIS — Z1231 Encounter for screening mammogram for malignant neoplasm of breast: Secondary | ICD-10-CM

## 2020-02-20 ENCOUNTER — Other Ambulatory Visit: Payer: Self-pay

## 2020-02-20 ENCOUNTER — Ambulatory Visit
Admission: RE | Admit: 2020-02-20 | Discharge: 2020-02-20 | Disposition: A | Discharge status: Home / Self Care | Payer: BC Managed Care – PPO | Source: Ambulatory Visit | Attending: Obstetrics and Gynecology | Admitting: Obstetrics and Gynecology

## 2020-02-20 DIAGNOSIS — Z1231 Encounter for screening mammogram for malignant neoplasm of breast: Secondary | ICD-10-CM

## 2020-03-05 ENCOUNTER — Ambulatory Visit (INDEPENDENT_AMBULATORY_CARE_PROVIDER_SITE_OTHER): Payer: BC Managed Care – PPO | Admitting: Physician Assistant

## 2020-03-05 ENCOUNTER — Other Ambulatory Visit: Payer: Self-pay

## 2020-03-05 ENCOUNTER — Encounter: Payer: Self-pay | Admitting: Physician Assistant

## 2020-03-05 DIAGNOSIS — L821 Other seborrheic keratosis: Secondary | ICD-10-CM

## 2020-03-05 DIAGNOSIS — Z1283 Encounter for screening for malignant neoplasm of skin: Secondary | ICD-10-CM

## 2020-03-05 DIAGNOSIS — Z86018 Personal history of other benign neoplasm: Secondary | ICD-10-CM

## 2020-03-05 DIAGNOSIS — L578 Other skin changes due to chronic exposure to nonionizing radiation: Secondary | ICD-10-CM

## 2020-03-05 DIAGNOSIS — D485 Neoplasm of uncertain behavior of skin: Secondary | ICD-10-CM | POA: Diagnosis not present

## 2020-03-05 DIAGNOSIS — L814 Other melanin hyperpigmentation: Secondary | ICD-10-CM | POA: Diagnosis not present

## 2020-03-05 DIAGNOSIS — D1800 Hemangioma unspecified site: Secondary | ICD-10-CM

## 2020-03-05 NOTE — Patient Instructions (Signed)

## 2020-03-05 NOTE — Progress Notes (Signed)
   Follow-Up Visit   Subjective  Chloe Leonard is a 64 y.o. female who presents for the following: Follow-up (skin check).   The following portions of the chart were reviewed this encounter and updated as appropriate: Tobacco  Allergies  Meds  Problems  Med Hx  Surg Hx  Fam Hx      Objective  Well appearing patient in no apparent distress; mood and affect are within normal limits.  A full examination was performed including scalp, head, eyes, ears, nose, lips, neck, chest, axillae, abdomen, back, buttocks, bilateral upper extremities, bilateral lower extremities, hands, feet, fingers, toes, fingernails, and toenails. All findings within normal limits unless otherwise noted below.  Objective  Head - to toe: No signs of non-mole skin cancer.   Objective  Left Upper Back: All scars clear  Objective  Chest - Medial [facility]): Bichromic dark nested macule.       Assessment & Plan  Screening exam for skin cancer Head - to toe  Yearly skin exams  History of dysplastic nevus Left Upper Back  observe  Neoplasm of uncertain behavior of skin Chest - Medial (Center)  Skin / nail biopsy Type of biopsy: tangential   Informed consent: discussed and consent obtained   Timeout: patient name, date of birth, surgical site, and procedure verified   Procedure prep:  Patient was prepped and draped in usual sterile fashion (Non sterile) Prep type:  Chlorhexidine Anesthesia: the lesion was anesthetized in a standard fashion   Anesthetic:  1% lidocaine w/ epinephrine 1-100,000 local infiltration Instrument used: flexible razor blade   Outcome: patient tolerated procedure well   Post-procedure details: wound care instructions given    Specimen 1 - Surgical pathology Differential Diagnosis: atypia Check Margins: No   Lentigines - Scattered tan macules - Discussed due to sun exposure - Benign, observe - Call for any changes  Seborrheic Keratoses - Stuck-on, waxy,  tan-brown papules and plaques  - Discussed benign etiology and prognosis. - Observe - Call for any changes  Hemangiomas - Red papules - Discussed benign nature - Observe - Call for any changes  Actinic Damage - diffuse scaly erythematous macules with underlying dyspigmentation - Recommend daily broad spectrum sunscreen SPF 30+ to sun-exposed areas, reapply every 2 hours as needed.  - Call for new or changing lesions.  Skin cancer screening performed today.  I, Hetty Linhart, PA-C, have reviewed all documentation's for this visit.  The documentation on 03/05/20 for the exam, diagnosis, procedures and orders are all accurate and complete.

## 2021-01-25 ENCOUNTER — Other Ambulatory Visit: Payer: Self-pay | Admitting: Obstetrics and Gynecology

## 2021-01-25 DIAGNOSIS — Z1231 Encounter for screening mammogram for malignant neoplasm of breast: Secondary | ICD-10-CM

## 2021-03-18 ENCOUNTER — Ambulatory Visit: Payer: BC Managed Care – PPO

## 2021-04-16 ENCOUNTER — Other Ambulatory Visit: Payer: Self-pay | Admitting: Gastroenterology

## 2021-04-16 DIAGNOSIS — R1084 Generalized abdominal pain: Secondary | ICD-10-CM

## 2021-04-22 ENCOUNTER — Other Ambulatory Visit: Payer: Self-pay | Admitting: Gastroenterology

## 2021-04-22 DIAGNOSIS — R1012 Left upper quadrant pain: Secondary | ICD-10-CM

## 2021-04-22 DIAGNOSIS — R198 Other specified symptoms and signs involving the digestive system and abdomen: Secondary | ICD-10-CM

## 2021-05-03 ENCOUNTER — Ambulatory Visit
Admission: RE | Admit: 2021-05-03 | Discharge: 2021-05-03 | Disposition: A | Discharge status: Home / Self Care | Payer: Self-pay | Source: Ambulatory Visit | Attending: Gastroenterology | Admitting: Gastroenterology

## 2021-05-03 DIAGNOSIS — R198 Other specified symptoms and signs involving the digestive system and abdomen: Secondary | ICD-10-CM

## 2021-05-03 DIAGNOSIS — R1012 Left upper quadrant pain: Secondary | ICD-10-CM

## 2021-05-03 MED ORDER — IOPAMIDOL (ISOVUE-300) INJECTION 61%
100.0000 mL | Freq: Once | INTRAVENOUS | Status: AC | PRN
  Administered 2021-05-03: 100 mL via INTRAVENOUS

## 2021-08-10 ENCOUNTER — Ambulatory Visit: Payer: BC Managed Care – PPO | Admitting: Physician Assistant

## 2021-10-15 ENCOUNTER — Other Ambulatory Visit: Payer: Self-pay | Admitting: Orthopedic Surgery

## 2021-10-15 DIAGNOSIS — G8929 Other chronic pain: Secondary | ICD-10-CM

## 2021-10-30 ENCOUNTER — Other Ambulatory Visit: Payer: Medicare Other

## 2021-11-02 ENCOUNTER — Ambulatory Visit
Admission: RE | Admit: 2021-11-02 | Discharge: 2021-11-02 | Disposition: A | Discharge status: Home / Self Care | Payer: Medicare Other | Source: Ambulatory Visit | Attending: Orthopedic Surgery | Admitting: Orthopedic Surgery

## 2021-11-02 DIAGNOSIS — G8929 Other chronic pain: Secondary | ICD-10-CM

## 2022-04-22 ENCOUNTER — Other Ambulatory Visit: Payer: Self-pay | Admitting: Obstetrics and Gynecology

## 2022-04-22 DIAGNOSIS — Z1231 Encounter for screening mammogram for malignant neoplasm of breast: Secondary | ICD-10-CM

## 2022-05-23 ENCOUNTER — Ambulatory Visit: Payer: Medicare Other

## 2022-05-24 ENCOUNTER — Ambulatory Visit
Admission: RE | Admit: 2022-05-24 | Discharge: 2022-05-24 | Disposition: A | Discharge status: Home / Self Care | Payer: Medicare Other | Source: Ambulatory Visit | Attending: Obstetrics and Gynecology | Admitting: Obstetrics and Gynecology

## 2022-05-24 DIAGNOSIS — Z1231 Encounter for screening mammogram for malignant neoplasm of breast: Secondary | ICD-10-CM

## 2022-05-25 ENCOUNTER — Ambulatory Visit: Payer: Medicare Other

## 2023-04-21 ENCOUNTER — Other Ambulatory Visit: Payer: Self-pay | Admitting: Obstetrics and Gynecology

## 2023-04-21 DIAGNOSIS — Z1231 Encounter for screening mammogram for malignant neoplasm of breast: Secondary | ICD-10-CM

## 2023-05-30 ENCOUNTER — Ambulatory Visit
Admission: RE | Admit: 2023-05-30 | Discharge: 2023-05-30 | Disposition: A | Discharge status: Home / Self Care | Payer: Medicare Other | Source: Ambulatory Visit | Attending: Obstetrics and Gynecology | Admitting: Obstetrics and Gynecology

## 2023-05-30 DIAGNOSIS — Z1231 Encounter for screening mammogram for malignant neoplasm of breast: Secondary | ICD-10-CM

## 2024-03-20 ENCOUNTER — Other Ambulatory Visit: Payer: Self-pay | Admitting: Obstetrics and Gynecology

## 2024-03-20 DIAGNOSIS — Z1231 Encounter for screening mammogram for malignant neoplasm of breast: Secondary | ICD-10-CM

## 2024-05-30 ENCOUNTER — Ambulatory Visit
Admission: RE | Admit: 2024-05-30 | Discharge: 2024-05-30 | Disposition: A | Discharge status: Home / Self Care | Source: Ambulatory Visit | Attending: Obstetrics and Gynecology | Admitting: Obstetrics and Gynecology

## 2024-05-30 DIAGNOSIS — Z1231 Encounter for screening mammogram for malignant neoplasm of breast: Secondary | ICD-10-CM

## 2024-05-31 ENCOUNTER — Other Ambulatory Visit: Payer: Self-pay | Admitting: Obstetrics and Gynecology

## 2024-05-31 DIAGNOSIS — N644 Mastodynia: Secondary | ICD-10-CM

## 2024-05-31 DIAGNOSIS — N63 Unspecified lump in unspecified breast: Secondary | ICD-10-CM

## 2024-06-07 ENCOUNTER — Encounter: Payer: Self-pay | Admitting: Obstetrics and Gynecology

## 2024-06-07 ENCOUNTER — Ambulatory Visit
Admission: RE | Admit: 2024-06-07 | Discharge: 2024-06-07 | Disposition: A | Discharge status: Home / Self Care | Source: Ambulatory Visit | Attending: Obstetrics and Gynecology | Admitting: Obstetrics and Gynecology

## 2024-06-07 DIAGNOSIS — N63 Unspecified lump in unspecified breast: Secondary | ICD-10-CM

## 2024-06-07 DIAGNOSIS — N644 Mastodynia: Secondary | ICD-10-CM
